# Patient Record
Sex: Male | Born: 1935 | Race: White | Hispanic: No | Marital: Married | Smoking: Former smoker
Health system: Southern US, Community
[De-identification: ages and names within clinical notes are randomized; demographics above are authoritative.]

## PROBLEM LIST (undated history)

## (undated) DIAGNOSIS — E785 Hyperlipidemia, unspecified: Secondary | ICD-10-CM

## (undated) DIAGNOSIS — S42309A Unspecified fracture of shaft of humerus, unspecified arm, initial encounter for closed fracture: Secondary | ICD-10-CM

## (undated) DIAGNOSIS — H9190 Unspecified hearing loss, unspecified ear: Secondary | ICD-10-CM

## (undated) DIAGNOSIS — D649 Anemia, unspecified: Secondary | ICD-10-CM

## (undated) DIAGNOSIS — M109 Gout, unspecified: Secondary | ICD-10-CM

## (undated) DIAGNOSIS — K602 Anal fissure, unspecified: Secondary | ICD-10-CM

## (undated) DIAGNOSIS — I714 Abdominal aortic aneurysm, without rupture, unspecified: Secondary | ICD-10-CM

## (undated) DIAGNOSIS — C4441 Basal cell carcinoma of skin of scalp and neck: Secondary | ICD-10-CM

## (undated) DIAGNOSIS — I739 Peripheral vascular disease, unspecified: Secondary | ICD-10-CM

## (undated) DIAGNOSIS — Z950 Presence of cardiac pacemaker: Secondary | ICD-10-CM

## (undated) DIAGNOSIS — I4891 Unspecified atrial fibrillation: Secondary | ICD-10-CM

## (undated) DIAGNOSIS — Z9289 Personal history of other medical treatment: Secondary | ICD-10-CM

## (undated) DIAGNOSIS — I4892 Unspecified atrial flutter: Secondary | ICD-10-CM

## (undated) DIAGNOSIS — I499 Cardiac arrhythmia, unspecified: Secondary | ICD-10-CM

## (undated) DIAGNOSIS — I495 Sick sinus syndrome: Secondary | ICD-10-CM

## (undated) DIAGNOSIS — I10 Essential (primary) hypertension: Secondary | ICD-10-CM

## (undated) DIAGNOSIS — M1990 Unspecified osteoarthritis, unspecified site: Secondary | ICD-10-CM

## (undated) HISTORY — DX: Abdominal aortic aneurysm, without rupture: I71.4

## (undated) HISTORY — DX: Unspecified atrial flutter: I48.92

## (undated) HISTORY — DX: Unspecified atrial fibrillation: I48.91

## (undated) HISTORY — DX: Anal fissure, unspecified: K60.2

## (undated) HISTORY — DX: Abdominal aortic aneurysm, without rupture, unspecified: I71.40

## (undated) HISTORY — DX: Unspecified fracture of shaft of humerus, unspecified arm, initial encounter for closed fracture: S42.309A

## (undated) HISTORY — PX: INGUINAL HERNIA REPAIR: SUR1180

## (undated) HISTORY — PX: CATARACT EXTRACTION W/ INTRAOCULAR LENS  IMPLANT, BILATERAL: SHX1307

## (undated) HISTORY — PX: SKIN CANCER EXCISION: SHX779

## (undated) HISTORY — DX: Hyperlipidemia, unspecified: E78.5

## (undated) HISTORY — DX: Sick sinus syndrome: I49.5

## (undated) HISTORY — DX: Essential (primary) hypertension: I10

## (undated) HISTORY — PX: TONSILLECTOMY: SUR1361

## (undated) HISTORY — DX: Unspecified osteoarthritis, unspecified site: M19.90

## (undated) HISTORY — PX: INSERT / REPLACE / REMOVE PACEMAKER: SUR710

## (undated) HISTORY — PX: ANAL FISSURE REPAIR: SHX2312

## (undated) HISTORY — DX: Peripheral vascular disease, unspecified: I73.9

## (undated) HISTORY — PX: OTHER SURGICAL HISTORY: SHX169

## (undated) HISTORY — PX: HEMORRHOID BANDING: SHX5850

## (undated) HISTORY — DX: Unspecified hearing loss, unspecified ear: H91.90

## (undated) HISTORY — PX: ABDOMINAL AORTIC ANEURYSM REPAIR: SUR1152

---

## 2007-07-10 ENCOUNTER — Ambulatory Visit: Payer: Self-pay | Admitting: Vascular Surgery

## 2007-10-09 ENCOUNTER — Encounter: Admission: RE | Admit: 2007-10-09 | Discharge: 2007-10-09 | Payer: Self-pay | Admitting: Vascular Surgery

## 2007-10-09 ENCOUNTER — Ambulatory Visit: Payer: Self-pay | Admitting: Vascular Surgery

## 2008-07-15 HISTORY — PX: US ECHOCARDIOGRAPHY: HXRAD669

## 2008-07-19 ENCOUNTER — Ambulatory Visit (HOSPITAL_COMMUNITY): Admission: RE | Admit: 2008-07-19 | Discharge: 2008-07-20 | Payer: Self-pay | Admitting: *Deleted

## 2008-09-16 ENCOUNTER — Ambulatory Visit: Payer: Self-pay | Admitting: Vascular Surgery

## 2008-09-16 ENCOUNTER — Encounter: Admission: RE | Admit: 2008-09-16 | Discharge: 2008-09-16 | Payer: Self-pay | Admitting: Vascular Surgery

## 2009-03-07 ENCOUNTER — Ambulatory Visit: Payer: Self-pay | Admitting: Vascular Surgery

## 2009-03-08 ENCOUNTER — Ambulatory Visit: Payer: Self-pay | Admitting: Vascular Surgery

## 2009-09-08 ENCOUNTER — Encounter (INDEPENDENT_AMBULATORY_CARE_PROVIDER_SITE_OTHER): Payer: Self-pay | Admitting: *Deleted

## 2009-09-08 ENCOUNTER — Ambulatory Visit: Payer: Self-pay | Admitting: Vascular Surgery

## 2009-09-08 ENCOUNTER — Encounter: Admission: RE | Admit: 2009-09-08 | Discharge: 2009-09-08 | Payer: Self-pay | Admitting: Vascular Surgery

## 2009-09-12 ENCOUNTER — Encounter: Payer: Self-pay | Admitting: Internal Medicine

## 2009-09-12 ENCOUNTER — Ambulatory Visit: Payer: Self-pay | Admitting: Cardiology

## 2009-11-09 ENCOUNTER — Ambulatory Visit: Payer: Self-pay | Admitting: Cardiology

## 2009-12-09 ENCOUNTER — Encounter: Payer: Self-pay | Admitting: Internal Medicine

## 2010-01-10 ENCOUNTER — Ambulatory Visit: Payer: Self-pay | Admitting: Internal Medicine

## 2010-01-10 DIAGNOSIS — I4892 Unspecified atrial flutter: Secondary | ICD-10-CM | POA: Insufficient documentation

## 2010-01-10 DIAGNOSIS — I4891 Unspecified atrial fibrillation: Secondary | ICD-10-CM | POA: Insufficient documentation

## 2010-01-10 DIAGNOSIS — I10 Essential (primary) hypertension: Secondary | ICD-10-CM

## 2010-02-12 ENCOUNTER — Telehealth: Payer: Self-pay | Admitting: Adult Health

## 2010-02-23 ENCOUNTER — Encounter (INDEPENDENT_AMBULATORY_CARE_PROVIDER_SITE_OTHER): Payer: Self-pay | Admitting: *Deleted

## 2010-03-13 NOTE — Miscellaneous (Signed)
Summary: Device preload  Clinical Lists Changes  Observations: Added new observation of PPM INDICATN: A-fib (12/09/2009 9:40) Added new observation of MAGNET RTE: BOL 85 ERI 65 (12/09/2009 9:40) Added new observation of PPMLEADSTAT2: active (12/09/2009 9:40) Added new observation of PPMLEADSER2: [id number] (12/09/2009 9:40) Added new observation of PPMLEADMOD2: 5076  (12/09/2009 9:40) Added new observation of PPMLEADDOI2: 07/19/2008  (12/09/2009 9:40) Added new observation of PPMLEADLOC2: RV  (12/09/2009 9:40) Added new observation of PPMLEADSTAT1: active  (12/09/2009 9:40) Added new observation of PPMLEADSER1: [id number]  (12/09/2009 9:40) Added new observation of PPMLEADMOD1: 5076  (12/09/2009 9:40) Added new observation of PPMLEADDOI1: 07/19/2008  (12/09/2009 9:40) Added new observation of PPMLEADLOC1: RA  (12/09/2009 9:40) Added new observation of PPM IMP MD: Charlynn Court  (12/09/2009 9:40) Added new observation of PPM DOI: 07/19/2008  (12/09/2009 9:40) Added new observation of PPM SERL#: [id number]  (12/09/2009 9:40) Added new observation of PPM MODL#: [id number]  (12/09/2009 9:40) Added new observation of PACEMAKERMFG: Medtronic  (12/09/2009 9:40) Added new observation of PPM REFER MD: Vonna Drafts  (12/09/2009 9:40) Added new observation of PACEMAKER MD: Hillis Range, MD  (12/09/2009 9:40)      PPM Specifications Following MD:  Hillis Range, MD     Referring MD:  Vonna Drafts PPM Vendor:  Medtronic     PPM Model Number:  [id number]     PPM Serial Number:  [id number] PPM DOI:  07/19/2008     PPM Implanting MD:  Charlynn Court  Lead 1    Location: RA     DOI: 07/19/2008     Model #: [id number]     Serial #: [id number]     Status: active Lead 2    Location: RV     DOI: 07/19/2008     Model #: [id number]     Serial #: [id number]     Status: active  Magnet Response Rate:  BOL 85 ERI 65  Indications:  A-fib

## 2010-03-13 NOTE — Letter (Signed)
Summary: [facility]  [facility]   Imported By: Marylou Mccoy 01/19/2010 17:24:35  _____________________________________________________________________  External Attachment:    Type:   Image     Comment:   External Document

## 2010-03-13 NOTE — Assessment & Plan Note (Signed)
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  He reports doing well at this time.  He has atrial flutter and atrial fibrillation but is relatively asymptomatic with these.  He has been iniated on pradaxa without troubles with bleeding.  He remains very active.He  denies symptoms of palpitations, chest pain, shortness of breath, orthopnea, PND, lower extremity edema, dizziness, presyncope, syncope, or neurologic sequela. The patient is tolerating medications without difficulties and is otherwise without complaint today.   Current Medications (verified): 1)  Pradaxa 150 Mg Caps (Dabigatran Etexilate Mesylate) .... Two Times A Day 2)  Metoprolol Tartrate 50 Mg Tabs (Metoprolol Tartrate) .... Take One Tablet By Mouth Twice A Day 3)  Fosinopril Sodium 40 Mg Tabs (Fosinopril Sodium) .... 1/2 Tablet Once Daily 4)  Digoxin 0.125 Mg Tabs (Digoxin) .... Take One Tablet By Mouth Daily 5)  Finasteride 5 Mg Tabs (Finasteride) .... Once Daily 6)  Allopurinol 300 Mg Tabs (Allopurinol) .... Once Daily 7)  Simvastatin 20 Mg Tabs (Simvastatin) .... Take One Tablet By Mouth Daily At Bedtime 8)  Aspirin 81 Mg Tbec (Aspirin) .... Take One Tablet By Mouth Daily 9)  Vitamin D (Ergocalciferol) 50000 Unit Caps (Ergocalciferol) .... Montthly 10)  Calcium .... Uad  Allergies (verified): No Known Drug Allergies  Past History:  Past Medical History: Atrial fibrillation and atrial flutter Symptomatic bradycardia s/p PPM (MDT) Aortic aneurysm with prior stent graft repair.  This was done in [place] in 2000.  He had a   complication of the left limb of his graft occlusion and had a subsequent fem-fem bypass.  HL HTN  Past Surgical History: s/p PPM (MDT) 07/19/08   Family History: pt  unaware of significant FH  Social History: Pt lives in [place] He denies Tob or ETOH  Review of Systems       All systems are reviewed and negative except as listed in the HPI.   Vital Signs:  Patient profile:   77 year old male Height:      69 inches Weight:      184 pounds BMI:     27.27 Pulse rate:   72 / minute BP sitting:   150 / 82  (left arm)  Vitals Entered By: Laurance Flatten CMA (January 10, 2010 11:51 AM)  Physical Exam  General:  elderly, NAD Head:  normocephalic and atraumatic Eyes:  PERRLA/EOM intact; conjunctiva and lids normal. Mouth:  Teeth, gums and palate normal. Oral mucosa normal. Neck:  supple Chest Wall:  pacemaker pocket is well healed Lungs:  Clear bilaterally to auscultation and percussion. Heart:  Non-displaced PMI, chest non-tender; regular rate and rhythm, S1, S2 without murmurs, rubs or gallops. Carotid upstroke normal, no bruit. Normal abdominal aortic size, no bruits. Femorals normal pulses, no bruits. Pedals normal pulses. No edema, no varicosities. Abdomen:  Bowel sounds positive; abdomen soft and non-tender without masses, organomegaly, or hernias noted. No hepatosplenomegaly. Msk:  Back normal, normal gait. Muscle strength and tone normal. Pulses:  pulses normal in all 4 extremities Extremities:  No clubbing or cyanosis. Neurologic:  Alert and oriented x 3.   PPM Specifications Following MD:  Hillis Range, MD     Referring MD:  Vonna Drafts PPM Vendor:  Medtronic     PPM Model Number:  [id number]     PPM Serial Number:  [id number] PPM DOI:  07/19/2008     PPM Implanting MD:  Charlynn Court  Lead 1    Location: RA     DOI: 07/19/2008     Model #: 5076     Serial #: [id number]     Status: active Lead 2    Location: RV     DOI: 07/19/2008     Model #: 6237     Serial #: [id number]     Status: active  Magnet Response Rate:  BOL 85 ERI 65  Indications:  A-fib   PPM Follow Up Battery Voltage:  2.99 V       PPM Device  Measurements Atrium  Amplitude: 2.5 mV, Impedance: 480 ohms, Threshold: 0.5 V at 0.4 msec Right Ventricle  Amplitude: 5.9 mV, Impedance: 608 ohms, Threshold: 0.5 V at 0.4 msec  Episodes MS Episodes:  8595     Percent Mode Switch:  7.1%     Ventricular High Rate:  1     Atrial Pacing:  84.9%     Ventricular Pacing:  2.3%  Parameters Mode:  MVP     Lower Rate Limit:  70     Upper Rate Limit:  130 Paced AV Delay:  180     Sensed AV Delay:  150 Next Cardiology Appt Due:  06/12/2010 Tech Comments:  [facility]---8595 AFLUTTER EPISODES--LONGEST WAS 2 HRS.  7.1% OF TIME. + PRADAXA.  NORMAL DEVICE FUNCTION.  CHANGED RV OUTPUT FROM 2.0 TO 2.5 V.  ROV IN 6 MTHS W/DEVICE CLINIC.  PT PREFERS OV RATHER THAN CARELINK. Vella Kohler  January 10, 2010 12:06 PM MD Comments:  agree  Impression & Recommendations:  Problem # 1:  PACEMAKER, PERMANENT (ICD-V45.01) The patient has a pacemkaer implanted for bradycardia. He appears to be doing well normal pacemaker function as above  Problem # 2:  ATRIAL FLUTTER (ICD-427.32) The patient has a h/o afib and atrial flutter He is asymptomatic and appropriately anticoagulated with pradaxa Given advanced age, I would not recommend ablation or aggressive attempts at rhythm control at this time  Problem # 3:  ESSENTIAL HYPERTENSION, BENIGN (ICD-401.1) stable no changes  Patient Instructions: 1)  Your physician recommends that you schedule a follow-up appointment in: 6 months with device clinic. 2)  Your physician recommends that you continue on your current medications as directed. Please refer to the Current Medication list given to you today.

## 2010-03-15 NOTE — Letter (Signed)
Summary: New Patient letter  [facility]  [address]   [place], [place] [address]   Phone: [phone]  Fax: [phone]       02/23/2010 MRN: [id number]  [name] [name] [address] [place], [place]  [address]  Dear [name] [name],  Welcome to the Gastroenterology Division at [facility].    You are scheduled to see Dr.  Arlyce Dice on 04/10/2010 at 11:00am  on the 3rd floor at [facility], [address]  We ask that you try to arrive at our office 15 minutes prior to your appointment time to allow for check-in.  We would like you to complete the enclosed self-administered evaluation form prior to your visit and bring it with you on the day of your appointment.  We will review it with you.  Also, please bring a complete list of all your medications or, if you prefer, bring the medication bottles and we will list them.  Please bring your insurance card so that we may make a copy of it.  If your insurance requires a referral to see a specialist, please bring your referral form from your primary care physician.  Co-payments are due at the time of your visit and may be paid by cash, check or credit card.     Your office visit will consist of a consult with your physician (includes a physical exam), any laboratory testing he/she may order, scheduling of any necessary diagnostic testing (e.g. x-ray, ultrasound, CT-scan), and scheduling of a procedure (e.g. Endoscopy, Colonoscopy) if required.  Please allow enough time on your schedule to allow for any/all of these possibilities.    If you cannot keep your appointment, please call [phone] to cancel or reschedule prior to your appointment date.  This allows [place] the opportunity to schedule an appointment for another patient in need of care.  If you do not cancel or reschedule by 5 p.m. the business day prior to your appointment date, you will be charged a $50.00 late cancellation/no-show fee.    Thank you for  choosing [facility] for your medical needs.  We appreciate the opportunity to care for you.  Please visit [place] at our website  to learn more about our practice.                     Sincerely,                                                             The Gastroenterology Division

## 2010-03-15 NOTE — Progress Notes (Signed)
  Phone Note Call from Patient   Reason for Call: Acute Illness Action Taken: Phone Call Completed Summary of Call: Mr. [name] [name] called stating his blood pressure was elevated at 182/104.  He is on fosinopril 40 mg 1/2 tablet daily, and metoprolol 50 mg two times a day.  I asked him to take additional 20mg  of fosinopril.  He is to check again in 2 hours.  He returned the call and said that his BP is now 170/98 HR is 100.[name] [name]  He does not feel short of breath, dizziness or having chest pain.  I have advised him to take additional 25mg  of metoprolol .  If his BP remains elevated he is to come to ER or call us back. Initial call taken by: Joni Reining NP

## 2010-03-15 NOTE — Letter (Addendum)
Summary: Self-Recorded Medicine & Medical Hx  Self-Recorded Medicine & Medical Hx   Imported By: Marylou Mccoy 01/19/2010 17:19:15  _____________________________________________________________________  External Attachments:     1. Type:   Image          Comment:   External Document    2. Type:   Image          Comment:   External Document

## 2010-04-06 ENCOUNTER — Ambulatory Visit (INDEPENDENT_AMBULATORY_CARE_PROVIDER_SITE_OTHER): Payer: Medicare Other | Admitting: Cardiology

## 2010-04-06 DIAGNOSIS — I495 Sick sinus syndrome: Secondary | ICD-10-CM

## 2010-04-06 DIAGNOSIS — Z950 Presence of cardiac pacemaker: Secondary | ICD-10-CM

## 2010-04-06 DIAGNOSIS — I10 Essential (primary) hypertension: Secondary | ICD-10-CM

## 2010-04-10 ENCOUNTER — Encounter: Payer: Self-pay | Admitting: Gastroenterology

## 2010-04-10 ENCOUNTER — Ambulatory Visit (INDEPENDENT_AMBULATORY_CARE_PROVIDER_SITE_OTHER): Payer: Medicare Other | Admitting: Gastroenterology

## 2010-04-10 DIAGNOSIS — K648 Other hemorrhoids: Secondary | ICD-10-CM

## 2010-04-10 DIAGNOSIS — I495 Sick sinus syndrome: Secondary | ICD-10-CM | POA: Insufficient documentation

## 2010-04-19 NOTE — Letter (Signed)
Summary: Results Letter  [facility]  [address] [place], [place] [address]   Phone: [phone]  Fax: [phone]        April 10, 2010 MRN: [id number]    FREDDI SCHRAGER [address] [place], [place]  [address]    Dear Mr. Pulsifer,   It is my pleasure to have treated you recently as a new patient in my office.  I appreciate your confidence and the opportunity to participate in your care.  Since I do have a busy inpatient endoscopy schedule and office schedule, my office hours vary weekly.  I am, however, available for emergency calls every day through my office.  If I cannot promptly meet an urgent office appointment, another one of our gastroenterologists will be able to assist you.  My well-trained staff are prepared to help you at all times.  For emergencies after office hours, a physician from our gastroenterology section is always available through my 24-hour answering service.  While you are under my care, I encourage discussion of your questions and concerns, and I will be happy to return your calls as soon as I am available.  Once again, I welcome you as a new patient and I look forward to a happy and healthy relationship.   Sincerely,     Barbette Hair. Arlyce Dice, MD,FACG         Sincerely,  Louis Meckel MD  This letter has been electronically signed by your physician.  Appended Document: Results Letter letter mailed

## 2010-04-19 NOTE — Assessment & Plan Note (Signed)
Summary: HEMORRHOIDS.Lennon Alstrom  POL ADVISED   History of Present Illness Visit Type: Initial Consult Primary GI MD: Melvia Heaps MD [facility] Primary Provider: Jarome Matin, MD Requesting Provider: Jarome Matin, MD Chief Complaint: hemmorhoids History of Present Illness:    Mr. Hendershott is a pleasant, 77 year old white male referred at the request of Dr. Eloise Harman for evaluation of hemorrhoids.   Chief complaint is difficulty cleaning himself after bowel movement. He is without pain bleeding or change in bowel habits. He has regular bowel movements. He is on    pradaxa for atrial fibrillation.   Last colonoscopy was about 10 years ago.   GI Review of Systems    Reports abdominal pain.     Location of  Abdominal pain: lower abdomen.    Denies acid reflux, belching, bloating, chest pain, dysphagia with liquids, dysphagia with solids, heartburn, loss of appetite, nausea, vomiting, vomiting blood, weight loss, and  weight gain.      Reports diarrhea and  hemorrhoids.     Denies anal fissure, black tarry stools, change in bowel habit, constipation, diverticulosis, fecal incontinence, heme positive stool, irritable bowel syndrome, jaundice, light color stool, liver problems, rectal bleeding, and  rectal pain. Preventive Screening-Counseling & Management  Alcohol-Tobacco     Smoking Status: quit      Drug Use:  no.      Current Medications (verified): 1)  Pradaxa 150 Mg Caps (Dabigatran Etexilate Mesylate) .... Two Times A Day 2)  Metoprolol Tartrate 50 Mg Tabs (Metoprolol Tartrate) .... Take One Tablet By Mouth Twice A Day 3)  Fosinopril Sodium 40 Mg Tabs (Fosinopril Sodium) .... 1/2 Tablet Once Daily 4)  Digoxin 0.125 Mg Tabs (Digoxin) .... Take One Tablet By Mouth Daily 5)  Finasteride 5 Mg Tabs (Finasteride) .... Once Daily 6)  Allopurinol 300 Mg Tabs (Allopurinol) .... Once Daily 7)  Simvastatin 20 Mg Tabs (Simvastatin) .... Take One Tablet By Mouth Daily At Bedtime 8)  Aspirin 81 Mg Tbec  (Aspirin) .... Take One Tablet By Mouth Daily 9)  Vitamin D (Ergocalciferol) 50000 Unit Caps (Ergocalciferol) .... Montthly 10)  Calcium Carbonate 1.5 Gm Tablet .... Take 1 Tablet By Mouth Once Daily 11)  Docusate Sodium 100 Mg Caps (Docusate Sodium) .... Take 1 Caspsule By Mouth Once Daily 12)  Amlodipine Besylate 5 Mg Tabs (Amlodipine Besylate) .... Take 1 Tablet By Mouth Once Daily  Allergies (verified): No Known Drug Allergies  Past History:  Past Medical History: Atrial fibrillation and atrial flutter Symptomatic bradycardia s/p PPM (MDT) Aortic aneurysm with prior stent graft repair.  This was done in [place] in 2000.  He had a   complication of the left limb of his graft occlusion and had a subsequent fem-fem bypass.  HL HTN Anal Fissure  Past Surgical History: s/p PPM (MDT) 07/19/08  Cataract Extraction Hernia Surgery Pacemaker Tonsillectomy Abdominal aortic aneurysm repair x3 Melenoma removed x4  Social History: Pt lives in [place] He denies Tob or ETOH Occupation: Retired Psychologist, educational Patient is a former smoker.  Alcohol Use - yes Daily Caffeine Use Illicit Drug Use - no Smoking Status:  quit Drug Use:  no  Review of Systems       The patient complains of arthritis/joint pain, back pain, swelling of feet/legs, and urination - excessive.  The patient denies allergy/sinus, anemia, anxiety-new, blood in urine, breast changes/lumps, change in vision, confusion, cough, coughing up blood, depression-new, fainting, fatigue, fever, headaches-new, heart murmur, heart rhythm changes, itching, menstrual pain, muscle pains/cramps, night sweats, nosebleeds, pregnancy symptoms,  shortness of breath, skin rash, sleeping problems, sore throat, swollen lymph glands, thirst - excessive , urination - excessive , urination changes/pain, urine leakage, vision changes, and voice change.         All other systems were reviewed and were negative   Vital Signs:  Patient profile:   77  year old male Height:      69 inches Weight:      185 pounds BMI:     27.42 Pulse rate:   64 / minute Pulse rhythm:   regular BP sitting:   128 / 70  (left arm) Cuff size:   regular  Vitals Entered By: June McMurray CMA Duncan Dull) (April 10, 2010 11:23 AM)  Physical Exam  Additional Exam:  Physical Exam: General:   WDWN HEENT:   anicteric.  No pharyngeal abnormalities Neck:   No masses, thyroidmegaly Nodes:   No cervical, axillary, inguinal adenopathy Chest:    Clear to auscultation Cardiac:   No murmurs, gallops, rubs Abdomen:   BS active.  No abd masses, tenderness, organomegaly Rectal:   No masses.  Stool hemeoccut negative;  there is decreased sphincter tone. Prostate is symmetrically enlarged. There are  small grade 3 hemorrhoids. Extremities:   No cyanosis, clubbing, edema Skeletal:   No deformities Neuro:   Alert, oriented x3.  No focal abnormalitie     Impression & Recommendations:  Problem # 1:  INTERNAL HEMORRHOIDS WITHOUT MENTION COMP (ICD-455.0)  His symptoms of difficulty with cleaning are likely due to hemorrhoids.  Recommendations #1 Anusol HC suppositories  #2 fiber supplementation   Should his symptoms continue unabated despite medical therapy. I would consider band ligation of his hemorrhoids.  Problem # 2:  LONG-TERM (CURRENT) USE OF ANTICOAGULANTS (ICD-V58.61) Assessment: Comment Only  Problem # 3:  ATRIAL FLUTTER (ICD-427.32) Assessment: Comment Only  Problem # 4:  PACEMAKER, PERMANENT (ICD-V45.01) Assessment: Comment Only  Patient Instructions: 1)  Copy sent to : Jarome Matin, MD 2)  You can pick up your new rx from your pharmacy today 3)  The medication list was reviewed and reconciled.  All changed / newly prescribed medications were explained.  A complete medication list was provided to the patient / caregiver. Prescriptions: ANUSOL-HC 25 MG SUPP (HYDROCORTISONE ACETATE) take one PR  #14 x 1   Entered and Authorized by:    Louis Meckel MD   Signed by:   Louis Meckel MD on 04/10/2010   Method used:   Electronically to        [facility]  [phone]* (retail)       [address] [place], [place]  [address]       Ph: [phone] or [phone]       Fax: [phone]   RxID:   [id number]

## 2010-06-14 ENCOUNTER — Encounter: Payer: Medicare Other | Admitting: *Deleted

## 2010-06-19 ENCOUNTER — Other Ambulatory Visit: Payer: Self-pay | Admitting: Vascular Surgery

## 2010-06-19 DIAGNOSIS — I714 Abdominal aortic aneurysm, without rupture: Secondary | ICD-10-CM

## 2010-06-26 NOTE — Assessment & Plan Note (Signed)
OFFICE VISIT   [name] [name], [name] [name]  DOB:  [birth year]                                       09/08/2009  EAVWU#:[id number]   Patient presents today for a follow-up of his stent graft repair of  abdominal aortic aneurysm.  This was done in [place] in 2000.  He had a  complication of the left limb of his graft occlusion and had a  subsequent fem-fem bypass.  He is here today for CT follow-up of this.   On my last visit with patient, he was complaining of what seemed to be  neuropathic-type pain in his lower extremities, and he reports that this  has markedly improved since my last visit with him in January.  He has  had no specific treatment.  He has no new major difficulties.  He has no  symptoms referable to his aneurysm.   PHYSICAL EXAMINATION:  Abdominal exam reveals some pulsation in his  aorta.  He does have 2+ femoral pulses bilaterally with a patent fem-fem  bypass.  Blood pressure today is 133/72, pulse 73, respirations 18.  His  O2 saturations are 96% on room air.  HEENT is normal.  Musculoskeletal  shows no major deformity or cyanosis.  Neurologic:  No focal weakness or  paresthesias.  Skin without ulcers or rashes.   He had a CT scan today and due to his renal insufficiency, this was a  noncontrast study.  This shows no change in the appearance of his  aneurysm with a stable location of the stent graft and the maximal  diameter of his aorta at 8.9 cm.   I discussed this with patient and his wife and have recommended that we  drop back to every other year CT scan since this has been stable for  many years.  He will notify us should he develop any difficulty in the  interim.     Larina Earthly, M.D.  Electronically Signed   TFE/MEDQ  D:  09/08/2009  T:  09/08/2009  Job:  [id number]   cc:   Barry Dienes. Eloise Harman, M.D.  Chrisandra Carota

## 2010-06-26 NOTE — Assessment & Plan Note (Signed)
OFFICE VISIT   [name] [name], [name] [name]  DOB:  1919-09-13                                       03/08/2009  ZOXWR#:[id number]   The patient presents today for evaluation of lower extremity discomfort  and tingling.  He is well-known to me from follow-up of an aortic stent  graft placed in [place] in 2000.  I have been doing serial follow-ups of  his prior-placed stent graft since my first visit with him in May 2009.  He had his last CT scan for follow-up of this in August 2010.  He  presents today with a history of progressive discomfort in both groins  and both thighs, occasionally in his buttocks and occasionally in his  low back.  He reports a tingling sensation in his legs from his knees  distally.  He said he had had this at some degree in the past but has  been worse recently, and also he has discomfort in his groins which is  positional.  He denies any specific tenderness in his groins.   His social history is unchanged.  He is married.  He is here with his  wife today.   REVIEW OF SYSTEMS:  Otherwise unremarkable.  He does not have any  abdominal pain and does occasionally have some low back pain.   PHYSICAL EXAMINATION:  He is a well-developed white male appearing  younger than his stated age of 50.  He is in no acute stress.  Blood  pressure is 157/80, heart rate 70, respirations 18.  His HEENT is  normal.  Abdominal exam is nontender, no masses.  He does have a  prominent pulsation in his aorta.  Musculoskeletal shows no deformities,  no cyanosis.  Neurologically, no focal weakness or paresthesias.  Skin  is without ulcer or rashes.  He does have 2+ femoral pulses bilaterally  with no evidence of erythema or tenderness.  He has a palpable fem-fem  graft pulse.  He has 2+ popliteal pulses bilaterally.  He has 2+ to 3+  right posterior tibial pulse and a 1+ left posterior tibial pulse.   I had a long discussion with the patient and his family present.   I do  not feel that there is any association with his prior stent graft repair  to these newer symptoms.  It appears to be more related to a neurologic  event than other.  I have reassured him I do not feel any other workup  is warranted from a vascular standpoint and we will see him again in  August 2011 with a repeat CT scan.  I do not feel that he has any  evidence of infection or other false aneurysm or other etiology that  would explain this from a standpoint of his prior fem-fem bypass.  They  were reassured with this discussion and are comfortable with observation  only at this time.  I would suggest a neurologic evaluation should he  have progressive discomfort.     Larina Earthly, M.D.  Electronically Signed   TFE/MEDQ  D:  03/08/2009  T:  03/09/2009  Job:  [id number]   cc:   Barry Dienes. Eloise Harman, M.D.

## 2010-06-26 NOTE — Assessment & Plan Note (Signed)
OFFICE VISIT   [name] [name], [name] [name]  DOB:  08-Dec-1919                                       10/09/2007  ZOXWR#:[id number]   Here today for continued follow-up. The patient presents today for  follow-up of his stent graft due to abdominal aortic aneurysm.  I had  seen him initially in May 2009.  He had an extensive prior treatment for  his aneurysm initially in 1990 with an anterior graft and has had  subsequent retreatments over the course of the years.  He has a known  aneurysm sac of 8.5 cm.  He has no new difficulties since my last visit  with him in May, he has a walking program, reports occasional shin  splints that limit him but otherwise no difficulty.   PHYSICAL EXAM:  Well-developed, well-nourished white male appearing  stated age of 42.  His blood pressure is 150/80, pulse 72, respirations  18.  ABDOMINAL EXAM:  Reveals moderate obesity with no tenderness and no  aneurysm palpated.  He does have 2+ femoral pulses with a patent fem-fem  bypass.   He underwent a CT scan today and I have reviewed this with him.  This  shows no evidence of endoleak, he continues to have an 8.5 cm aneurysm  sac, he does have ectasia of the suprarenal aorta at 4.1 cm which I  discussed with him.  I am comfortable with his current treatment.  He  does have a stable aneurysm sac with an occluded limb in the fem-fem  bypass.  I discussed symptoms of the fem-fem graft occlusion and also  aneurysm leak.  He understands this and will notify us should he develop  any problems.  Otherwise, we will see him in 1 year with repeat CT scan.   Larina Earthly, M.D.  Electronically Signed   TFE/MEDQ  D:  10/09/2007  T:  10/12/2007  Job:  [id number]

## 2010-06-26 NOTE — Consult Note (Signed)
NEW PATIENT CONSULTATION   [name] [name], [name] [name]  DOB:  [birth year]                                       07/10/2007  ZOXWR#:[id number]   The patient presents today for ongoing followup of his extensive past  history for aneurysm repair.  He has a great deal of his medical records  with him from [place], [place], [facility], and also  followup in [place].  He had initially treatment of in infrarenal  abdominal aortic aneurysm with an Ancure graft in [facility] on  11/06/1998.  He subsequently had treatment in [place] which with what  sounds like coiling of what was described as a type 1 endoleak.  I do  not have any records of that procedure.  He subsequently underwent an  extensive operation which he reports took 9 hours at [facility] in  August 2006.  At that time he had his graft converted to an  aortouniiliac and had a fem-fem bypass.  Apparently had a type 1 endo  leak from the right limb of his aorta femoral Endograft.  Following that  surgery in 2006 he had extensive treatment in [place] with wound  problems from his fem-fem bypass, but subsequently healed these.  He had  been followed by Dr. Caleen Essex at [facility] in  [place], [place].  I have extensive records from Dr. Edward Jolly for  review.  His most recent CAT scan was in August 2008 showing maximal  size of his aneurysm sac at 8.5 cm and his most recent duplex was in  March showing no evidence of increasing size with a maximal diameter of  8.6 cm.  He has had a stable type 2 endoleak.  He, otherwise, is very  active and healthy, and is currently living in [place].   PAST HISTORY:  Significant for vertigo hypertension, benign prostatic  hypertrophy.  Does not have a family history of aneurysm.   SOCIAL HISTORY:  He quit smoking in 1973.  Prior to this he had a 50  pack year history.  Has occasional alcohol consumption.   ALLERGIES:  No known drug allergies.   MEDICATIONS:  Metoprolol, lisinopril, hydrochlorothiazide, Flomax,  Proscar, gemfibrozil, aspirin, and Os-Cal.   PHYSICAL EXAM:  Well-developed, well-nourished white male appearing  stated age of 54.  His carotid arteries have bruits bilaterally.  He has  2+ radial and 2+ femoral pulses bilaterally.  He does have palpable  popliteal pulses bilaterally with no evidence of aneurysm.  Abdominal  exam reveals moderate obesity and this is not tender.  His groin  incisions have completely healed.   I had a long discussion with the patient and his wife.  I explained the  need for ongoing surveillance as he is having done since placement of  his initial anterior graft in 2000.  He is due for a CAT scan in August  and we will see him at that time for a CAT scan evaluation.  He reports  that there was some issue regarding possible renal insufficiency and I  explained that we would check his renal function prior to perceive that  he is safe to proceed without contrast CT.  If not, he will have a  noncontrast CT and ultrasound of his stent graft.  I will see him again  in August with a CT scan.   Larina Earthly,  M.D.  Electronically Signed   TFE/MEDQ  D:  07/10/2007  T:  07/13/2007  Job:  [id number]   cc:   [facility]

## 2010-06-26 NOTE — Discharge Summary (Signed)
NAME:  [name] [name], [name] [name] NO.:  [id number]   MEDICAL RECORD NO.:  [id number]          PATIENT TYPE:  OIB   LOCATION:  2014                         FACILITY:  [facility]   PHYSICIAN:  Elmore Guise., M.D.DATE OF BIRTH:  08/09/1919   DATE OF ADMISSION:  07/19/2008  DATE OF DISCHARGE:  07/20/2008                               DISCHARGE SUMMARY   DISCHARGE DIAGNOSES:  1. History of sick sinus syndrome/tachycardia-bradycardia (status post      dual-chamber permanent pacemaker implant).  2. Hypertension.  3. Dyslipidemia.  4. Arthritis.   HISTORY OF PRESENT ILLNESS:  [name] [name] is a very pleasant 77 year old  white male who presented with episodes of atrial fibrillation.  He was  started on rate-control medicines.  He was noted to have significant  sinus pauses when he would convert to sinus rhythm.  His medications  were readjusted; however, he continued to have dizzy spells which  correlated with his bradycardia and sinus pauses.  He was referred for  dual-chamber permanent pacemaker implant.   HOSPITAL COURSE:  The patient's hospital course was uncomplicated.  He  underwent pacemaker implant on July 19, 2008.  He tolerated the procedure  well.  His postprocedure course went well.  He remained afebrile and in  sinus rhythm following his procedure.  He did have bouts of atrial  fibrillation when replacing his atrial lead.  He has been A-paced and V-  fibbed for the last 24 hours.  His blood pressure has ranged from 120-  130 over 60-70.  His O2 sats are 95%.  He has been up and ambulatory.  His pacemaker site looks good.  There is no swelling or erythema noted.  His postprocedure chest x-ray showed appropriate positioning of his  atrial and ventricular lead.  No pneumothorax was noted.  His pacemaker  was checked out on postprocedure day 1 and showed good sensing,  threshold, and impedance.  He will be discharged home today to continue  the following medications:  1.  Metoprolol 50 mg half tablet twice daily.  2. Fosinopril 40 mg half tablet daily.  3. Hydrochlorothiazide 25 mg half tablet daily.  4. Proscar 5 mg daily.  5. Lopid 600 mg half tablet daily.  6. Aspirin 81 mg daily.  7. Os-Cal 2 tablets daily.  8. Vicodin 5/500 one to two every 6 hours as needed for pain.   A postpacemaker restriction sheet was given to him and we discussed this  in detail.  He is to keep his pacemaker site clean and dry for the next  week.  He is to Betadine his Steri-Strips for the next 3 days.  He is to  increase his activity slowly.  He is to call if he has any swelling,  fever, discharge from the area.  I did discuss that he would need to be  placed on Coumadin because of his paroxysmal atrial fibrillation;  however, we will hold on starting until he presents back for his wound  check to hopefully prevent any problems with hematoma or excessive  bruising/bleeding.  All of his questions were answered prior to  discharge.      Elmore Guise., M.D.  Electronically Signed     Elmore Guise., M.D.  Electronically Signed    TWK/MEDQ  D:  07/20/2008  T:  07/21/2008  Job:  [id number]   cc:   Peter M. Swaziland, M.D.

## 2010-06-26 NOTE — Assessment & Plan Note (Signed)
OFFICE VISIT   JANARD, CULP  DOB:  1919/02/26                                       09/16/2008  ZOXWR#:[id number]   Patient presents today for continued follow-up of his stent graft repair  of abdominal aortic aneurysm.  He reports that he had underwent a recent  pacemaker implantation for tachybrady syndrome and has done well with  this.  He has no symptoms of claudication and does not have any  abdominal discomfort.  He continues to be active with a daily walking  program at age 72.   PHYSICAL EXAMINATION:  A well-developed, well-nourished white male  appearing stated age of 77.  Blood pressure is 145/80, pulse 74,  respirations 18.  His radial pulses are 2+.  He has 2+ femoral pulses  and palpable dorsalis pedis pulses.  Abdominal exam reveals moderate  obesity.  I do not feel an aneurysm.  He has a palpable fem-fem bypass  graft pulse.   He underwent CT scan today, and I reviewed this with him.  It shows no  change in his prior study from 1 year ago.  He does have a large native  aneurysm sac at 8.7 cm.  He has no evidence of type 1 endoleak.  He does  have a small, probable type 2 endoleak in the posterior portion of the  sac, which is unchanged from prior studies.  His left limb of his  Endograft is chronically occluded, and his fem-fem bypass is patent.   I discussed this at length with Mr. Hanken, his wife and son-in-law  present, and I have recommended that we see him again on a yearly basis  with CAT scan to rule out any change.  He is now 10 years out from his  initial stent graft placement at [facility].   Larina Earthly, M.D.  Electronically Signed   TFE/MEDQ  D:  09/16/2008  T:  09/19/2008  Job:  [id number]   cc:   Barry Dienes. Eloise Harman, M.D.  Peter M. Swazil[place], M.D.

## 2010-07-02 ENCOUNTER — Ambulatory Visit (INDEPENDENT_AMBULATORY_CARE_PROVIDER_SITE_OTHER): Payer: Medicare Other | Admitting: *Deleted

## 2010-07-02 DIAGNOSIS — I4892 Unspecified atrial flutter: Secondary | ICD-10-CM

## 2010-07-02 DIAGNOSIS — Z950 Presence of cardiac pacemaker: Secondary | ICD-10-CM

## 2010-07-02 DIAGNOSIS — I495 Sick sinus syndrome: Secondary | ICD-10-CM

## 2010-07-02 NOTE — Progress Notes (Signed)
Pacer check

## 2010-07-03 ENCOUNTER — Ambulatory Visit (INDEPENDENT_AMBULATORY_CARE_PROVIDER_SITE_OTHER): Payer: Medicare Other | Admitting: Gastroenterology

## 2010-07-03 ENCOUNTER — Encounter: Payer: Self-pay | Admitting: Gastroenterology

## 2010-07-03 VITALS — BP 118/64 | HR 68 | Ht 69.0 in | Wt 187.0 lb

## 2010-07-03 DIAGNOSIS — K648 Other hemorrhoids: Secondary | ICD-10-CM

## 2010-07-03 NOTE — Assessment & Plan Note (Signed)
Symptoms have responded to rectal suppositories. I believe that his problems with hygiene are related to his prolapsing hemorrhoids. I explained that hemorrhoidal banding could ameliorate this condition. He will take this under advisement.

## 2010-07-03 NOTE — Patient Instructions (Signed)
Hemorrhoids Hemorrhoids are dilated (enlarged) veins around the rectum. Sometimes clots will form in the veins. This makes them swollen and painful. These are called thrombosed hemorrhoids. Causes of hemorrhoids include:  Pregnancy: this increases the pressure in the hemorrhoidal veins.   Constipation.   Straining to have a bowel movement.  HOME CARE INSTRUCTIONS  Eat a well balanced diet and drink 6 to 8 glasses of water every day to avoid constipation. You may also use a bulk laxative.   Avoid straining to have bowel movements.   Keep anal area dry and clean.   Only take over-the-counter or prescription medicines for pain, discomfort, or fever as directed by your caregiver.  If thrombosed:  Take hot sitz baths for 20 to 30 minutes, 3 to 4 times per day.   If the hemorrhoids are very tender and swollen, place ice packs on area as tolerated. Using ice packs between sitz baths may be helpful. Fill a plastic bag with ice and use a towel between the bag of ice and your skin.   Special creams and suppositories (Anusol, Nupercainal, Wyanoids) may be used or applied as directed.   Do not use a donut shaped pillow or sit on the toilet for long periods. This increases blood pooling and pain.   Move your bowels when your body has the urge; this will require less straining and will decrease pain and pressure.  Only take over-the-counter or prescription medicines for pain, discomfort, or fever as directed by your caregiver.  SEEK MEDICAL CARE IF:  You have increasing pain and swelling that is not controlled with your prescription.   You have uncontrolled bleeding.   You have an inability or difficulty having a bowel movement.   You have pain or inflammation outside the area of the hemorrhoids.    MAKE SURE YOU:   Understand these instructions.   Will watch your condition.   Will get help right away if you are not doing well or get worse.  Document Released: 01/26/2000 Document  Re-Released: 01/11/2008 [facility] Patient Information 2011 [place], [place]. Please consider a hemorrhoidal banding in the future If you decide contact our office to schedule

## 2010-07-03 NOTE — Progress Notes (Signed)
History of Present Illness:  Mr. Kinney has returned for followup of his hemorrhoids. He used Anusol a.c. Suppositories and  reports no further bleeding or discomfort. His main complaint is difficulty cleaning himself following a bowel movement due to protrusion of hemorrhoids.    Review of Systems: Pertinent positive and negative review of systems were noted in the above HPI section. All other review of systems were otherwise negative.    Current Medications, Allergies, Past Medical History, Past Surgical History, Family History and Social History were reviewed in [facility] electronic medical record  Vital signs were reviewed in today's medical record. Physical Exam: General: Well developed , well nourished, no acute distress  Extremities: No clubbing, cyanosis, edema or deformities noted Neurological: Alert oriented x 4, grossly nonfocal Psychological:  Alert and cooperative. Normal mood and affect

## 2010-07-18 ENCOUNTER — Telehealth: Payer: Self-pay | Admitting: Gastroenterology

## 2010-07-18 NOTE — Telephone Encounter (Signed)
Pt would like to schedule hemorrhoid banding. Does he just need a flex-sig with banding? Please advise.

## 2010-07-19 NOTE — Telephone Encounter (Signed)
yes

## 2010-07-23 NOTE — Telephone Encounter (Signed)
Left message for pt to call back  °

## 2010-07-23 NOTE — Telephone Encounter (Signed)
Pt aware of appt dates and times. 

## 2010-07-23 NOTE — Telephone Encounter (Signed)
Pt scheduled for previsit for 08/16/10@2pm , Flex-sig with banding scheduled at [facility] for 08/22/10@12 :30pm. Scheduled with Noreene Larsson #[id number]. Left message for pt to call back regarding appt dates and times.

## 2010-08-06 ENCOUNTER — Telehealth: Payer: Self-pay

## 2010-08-06 NOTE — Telephone Encounter (Signed)
Appt date and time ok per Dr. Arlyce Dice.

## 2010-08-06 NOTE — Telephone Encounter (Signed)
Message copied by Michele Mcalpine on Mon Aug 06, 2010 10:33 AM ------      Message from: Melvia Heaps MD D      Created: Mon Aug 06, 2010 10:18 AM      Regarding: RE: Reschedule       ok      ----- Message -----         From: Lily Lovings, RN         Sent: 08/02/2010   4:39 PM           To: Louis Meckel, MD      Subject: Reschedule                                               Dr. Arlyce Dice I had to reschedule this pts flex sig with banding to 08/29/10@12 :30pm. Just wanted to make sure that was ok with you. I had a hard time finding a spot.

## 2010-08-16 ENCOUNTER — Telehealth: Payer: Self-pay | Admitting: Cardiology

## 2010-08-16 ENCOUNTER — Telehealth: Payer: Self-pay | Admitting: *Deleted

## 2010-08-16 ENCOUNTER — Ambulatory Visit (AMBULATORY_SURGERY_CENTER): Payer: Medicare Other | Admitting: *Deleted

## 2010-08-16 VITALS — Ht 69.0 in | Wt 182.3 lb

## 2010-08-16 DIAGNOSIS — K648 Other hemorrhoids: Secondary | ICD-10-CM

## 2010-08-16 NOTE — Telephone Encounter (Signed)
Spoke with Dr Renie Ora office at [facility] about pts Pradaxa, Pt having procedure Flex with Banding on 7/18. They will fax ok as soon as Dr Thomasene Lot looks at it

## 2010-08-16 NOTE — Telephone Encounter (Signed)
PATIENT ON PRODAXIA, DR. KAPLAN NEEDS SOMETHING FAXED TO [place] PATIENT COMING OFF PRODAXIA.  PT HAVING FLEX WITH BANDING

## 2010-08-17 NOTE — Telephone Encounter (Signed)
Dr. Arlyce Dice is scheduled to do banding of hemorrhoids on 7/18 and wanted to know if should stop Pradaxa. Will check w/Dr.Jo on Monday and call him back. Will fax to Dr. Arlyce Dice.

## 2010-08-20 ENCOUNTER — Telehealth: Payer: Self-pay | Admitting: *Deleted

## 2010-08-20 NOTE — Telephone Encounter (Signed)
In response to his question about stopping Pradaxa before hemorrhoid banding. Dr. Swaziland said should stop Pradaxa 2 days prior to app and 3 days after procedure. Faxed note to Dr. Melvia Heaps.

## 2010-08-20 NOTE — Telephone Encounter (Signed)
Received letter from Dr Swaziland to have pt come off Pradaxa 48 hours prior to procedure and three days after. Per form faxed over

## 2010-08-22 ENCOUNTER — Encounter: Payer: Medicare Other | Admitting: Gastroenterology

## 2010-08-29 ENCOUNTER — Encounter: Payer: Medicare Other | Admitting: Gastroenterology

## 2010-08-29 ENCOUNTER — Ambulatory Visit (HOSPITAL_COMMUNITY)
Admission: RE | Admit: 2010-08-29 | Discharge: 2010-08-29 | Disposition: A | Discharge status: Home / Self Care | Payer: Medicare Other | Source: Ambulatory Visit | Attending: Gastroenterology | Admitting: Gastroenterology

## 2010-08-29 DIAGNOSIS — K648 Other hemorrhoids: Secondary | ICD-10-CM

## 2010-08-29 DIAGNOSIS — Z950 Presence of cardiac pacemaker: Secondary | ICD-10-CM | POA: Insufficient documentation

## 2010-09-04 ENCOUNTER — Other Ambulatory Visit: Payer: Self-pay | Admitting: Cardiology

## 2010-09-04 MED ORDER — DABIGATRAN ETEXILATE MESYLATE 150 MG PO CAPS: 150.0000 mg | ORAL_CAPSULE | Freq: Two times a day (BID) | ORAL | Status: DC

## 2010-09-04 NOTE — Telephone Encounter (Signed)
Called requesting refill on Pradaxa. [facility]

## 2010-09-04 NOTE — Telephone Encounter (Signed)
Has a question regarding an [facility] prescription that he spoke with you about last week. He said that they do not have the refill auth.

## 2010-09-07 ENCOUNTER — Inpatient Hospital Stay (HOSPITAL_COMMUNITY)
Admission: EM | Admit: 2010-09-07 | Discharge: 2010-09-12 | DRG: 920 | Disposition: A | Discharge status: Home / Self Care | Payer: Medicare Other | Attending: Internal Medicine | Admitting: Internal Medicine

## 2010-09-07 ENCOUNTER — Telehealth: Payer: Self-pay | Admitting: Gastroenterology

## 2010-09-07 DIAGNOSIS — Z950 Presence of cardiac pacemaker: Secondary | ICD-10-CM

## 2010-09-07 DIAGNOSIS — K626 Ulcer of anus and rectum: Secondary | ICD-10-CM | POA: Diagnosis present

## 2010-09-07 DIAGNOSIS — K922 Gastrointestinal hemorrhage, unspecified: Secondary | ICD-10-CM

## 2010-09-07 DIAGNOSIS — R579 Shock, unspecified: Secondary | ICD-10-CM

## 2010-09-07 DIAGNOSIS — D62 Acute posthemorrhagic anemia: Secondary | ICD-10-CM

## 2010-09-07 DIAGNOSIS — I4891 Unspecified atrial fibrillation: Secondary | ICD-10-CM

## 2010-09-07 DIAGNOSIS — Z7901 Long term (current) use of anticoagulants: Secondary | ICD-10-CM

## 2010-09-07 DIAGNOSIS — IMO0002 Reserved for concepts with insufficient information to code with codable children: Principal | ICD-10-CM | POA: Diagnosis present

## 2010-09-07 DIAGNOSIS — D696 Thrombocytopenia, unspecified: Secondary | ICD-10-CM | POA: Diagnosis present

## 2010-09-07 DIAGNOSIS — M129 Arthropathy, unspecified: Secondary | ICD-10-CM | POA: Diagnosis present

## 2010-09-07 DIAGNOSIS — Z87891 Personal history of nicotine dependence: Secondary | ICD-10-CM

## 2010-09-07 DIAGNOSIS — K648 Other hemorrhoids: Secondary | ICD-10-CM | POA: Diagnosis present

## 2010-09-07 DIAGNOSIS — Z7982 Long term (current) use of aspirin: Secondary | ICD-10-CM

## 2010-09-07 DIAGNOSIS — T8119XA Other postprocedural shock, initial encounter: Secondary | ICD-10-CM | POA: Diagnosis present

## 2010-09-07 DIAGNOSIS — I4892 Unspecified atrial flutter: Secondary | ICD-10-CM | POA: Diagnosis present

## 2010-09-07 DIAGNOSIS — Y849 Medical procedure, unspecified as the cause of abnormal reaction of the patient, or of later complication, without mention of misadventure at the time of the procedure: Secondary | ICD-10-CM | POA: Diagnosis present

## 2010-09-07 DIAGNOSIS — N179 Acute kidney failure, unspecified: Secondary | ICD-10-CM | POA: Diagnosis present

## 2010-09-07 DIAGNOSIS — E441 Mild protein-calorie malnutrition: Secondary | ICD-10-CM | POA: Diagnosis present

## 2010-09-07 LAB — CBC
Hemoglobin: 12.6 g/dL — ABNORMAL LOW (ref 13.0–17.0)
MCHC: 33.1 g/dL (ref 30.0–36.0)
MCV: 94.5 fL (ref 78.0–100.0)
MCV: 92.3 fL (ref 78.0–100.0)
Platelets: 167 10*3/uL (ref 150–400)
Platelets: 92 10*3/uL — ABNORMAL LOW (ref 150–400)
RBC: 4.03 MIL/uL — ABNORMAL LOW (ref 4.22–5.81)
RDW: 15.3 % (ref 11.5–15.5)
WBC: 10.9 10*3/uL — ABNORMAL HIGH (ref 4.0–10.5)
WBC: 7.9 10*3/uL (ref 4.0–10.5)

## 2010-09-07 LAB — PROTIME-INR
INR: 1.49 (ref 0.00–1.49)
Prothrombin Time: 18.3 seconds — ABNORMAL HIGH (ref 11.6–15.2)

## 2010-09-07 LAB — COMPREHENSIVE METABOLIC PANEL
ALT: 8 U/L (ref 0–53)
Albumin: 3.4 g/dL — ABNORMAL LOW (ref 3.5–5.2)
Chloride: 109 mEq/L (ref 96–112)
Creatinine, Ser: 1.39 mg/dL — ABNORMAL HIGH (ref 0.50–1.35)
Glucose, Bld: 141 mg/dL — ABNORMAL HIGH (ref 70–99)
Potassium: 5 mEq/L (ref 3.5–5.1)
Total Bilirubin: 0.6 mg/dL (ref 0.3–1.2)
Total Protein: 6.1 g/dL (ref 6.0–8.3)

## 2010-09-07 LAB — DIFFERENTIAL
Basophils Relative: 1 % (ref 0–1)
Eosinophils Absolute: 0.1 10*3/uL (ref 0.0–0.7)
Lymphocytes Relative: 15 % (ref 12–46)
Monocytes Absolute: 0.4 10*3/uL (ref 0.1–1.0)

## 2010-09-07 LAB — ABO/RH: ABO/RH(D): O NEG

## 2010-09-07 LAB — CARDIAC PANEL(CRET KIN+CKTOT+MB+TROPI): Relative Index: INVALID (ref 0.0–2.5)

## 2010-09-07 LAB — GLUCOSE, CAPILLARY

## 2010-09-07 MED ORDER — HYDROCORTISONE ACETATE 25 MG RE SUPP: 25.0000 mg | Freq: Two times a day (BID) | RECTAL | Status: DC

## 2010-09-07 NOTE — Telephone Encounter (Signed)
Received another call from the pts family. Family states that he has passed what appears to be some clots and he still has a steady drip of bright red blood from his rectum. Instructed them to take the pt to the ER to be evaluated.

## 2010-09-07 NOTE — Telephone Encounter (Signed)
Pts wife states that her husband had a bowel movement and is having rectal bleeding. States he is sitting on the toilet now and there is a steady drip of bright red blood. Spoke with Willette Cluster NP. Pt has not been using Anusol HC supp. Per Gunnar Fusi pt needs to use a supp now and is to use them twice a day. Pt should call us back this afternoon if the bleeding does not stop. Pts daughter verbalized understanding.

## 2010-09-08 ENCOUNTER — Inpatient Hospital Stay (HOSPITAL_COMMUNITY): Payer: Medicare Other

## 2010-09-08 LAB — CBC
HCT: 23.4 % — ABNORMAL LOW (ref 39.0–52.0)
HCT: 21.9 % — ABNORMAL LOW (ref 39.0–52.0)
HCT: 22 % — ABNORMAL LOW (ref 39.0–52.0)
Hemoglobin: 8 g/dL — ABNORMAL LOW (ref 13.0–17.0)
Hemoglobin: 7.3 g/dL — ABNORMAL LOW (ref 13.0–17.0)
Hemoglobin: 7.5 g/dL — ABNORMAL LOW (ref 13.0–17.0)
Hemoglobin: 7.9 g/dL — ABNORMAL LOW (ref 13.0–17.0)
MCH: 31.5 pg (ref 26.0–34.0)
MCH: 31.2 pg (ref 26.0–34.0)
MCHC: 34.2 g/dL (ref 30.0–36.0)
MCHC: 34.1 g/dL (ref 30.0–36.0)
MCHC: 34.1 g/dL (ref 30.0–36.0)
MCV: 92.2 fL (ref 78.0–100.0)
MCV: 92.1 fL (ref 78.0–100.0)
MCV: 92.4 fL (ref 78.0–100.0)
MCV: 92.1 fL (ref 78.0–100.0)
MCV: 91.7 fL (ref 78.0–100.0)
Platelets: 83 10*3/uL — ABNORMAL LOW (ref 150–400)
Platelets: 79 10*3/uL — ABNORMAL LOW (ref 150–400)
RBC: 2.69 MIL/uL — ABNORMAL LOW (ref 4.22–5.81)
RBC: 2.54 MIL/uL — ABNORMAL LOW (ref 4.22–5.81)
RBC: 2.37 MIL/uL — ABNORMAL LOW (ref 4.22–5.81)
RBC: 2.53 MIL/uL — ABNORMAL LOW (ref 4.22–5.81)
RDW: 15.3 % (ref 11.5–15.5)
WBC: 5.2 10*3/uL (ref 4.0–10.5)
WBC: 3.5 10*3/uL — ABNORMAL LOW (ref 4.0–10.5)

## 2010-09-08 LAB — CARDIAC PANEL(CRET KIN+CKTOT+MB+TROPI)
Relative Index: INVALID (ref 0.0–2.5)
Relative Index: INVALID (ref 0.0–2.5)
Total CK: 76 U/L (ref 7–232)
Total CK: 95 U/L (ref 7–232)
Troponin I: <0.3 ng/mL (ref <0.30)

## 2010-09-08 LAB — DIFFERENTIAL
Basophils Absolute: 0 10*3/uL (ref 0.0–0.1)
Eosinophils Relative: 3 % (ref 0–5)
Lymphocytes Relative: 22 % (ref 12–46)
Lymphs Abs: 1 10*3/uL (ref 0.7–4.0)
Monocytes Absolute: 0.3 10*3/uL (ref 0.1–1.0)
Neutro Abs: 3 10*3/uL (ref 1.7–7.7)

## 2010-09-08 LAB — PREPARE FRESH FROZEN PLASMA

## 2010-09-08 LAB — BASIC METABOLIC PANEL
Calcium: 7.8 mg/dL — ABNORMAL LOW (ref 8.4–10.5)
Chloride: 113 mEq/L — ABNORMAL HIGH (ref 96–112)
Creatinine, Ser: 1.2 mg/dL (ref 0.50–1.35)
GFR calc non Af Amer: 57 mL/min — ABNORMAL LOW (ref >60)

## 2010-09-09 LAB — BASIC METABOLIC PANEL
BUN: 15 mg/dL (ref 6–23)
Calcium: 8.3 mg/dL — ABNORMAL LOW (ref 8.4–10.5)
Creatinine, Ser: 1.02 mg/dL (ref 0.50–1.35)
GFR calc Af Amer: >60 mL/min (ref >60)
GFR calc non Af Amer: >60 mL/min (ref >60)

## 2010-09-09 LAB — CBC
HCT: 22 % — ABNORMAL LOW (ref 39.0–52.0)
MCH: 31.4 pg (ref 26.0–34.0)
MCH: 31.3 pg (ref 26.0–34.0)
MCHC: 33.6 g/dL (ref 30.0–36.0)
MCHC: 33.9 g/dL (ref 30.0–36.0)
MCHC: 34.2 g/dL (ref 30.0–36.0)
MCV: 92.1 fL (ref 78.0–100.0)
Platelets: 72 10*3/uL — ABNORMAL LOW (ref 150–400)
Platelets: 80 10*3/uL — ABNORMAL LOW (ref 150–400)
Platelets: 81 10*3/uL — ABNORMAL LOW (ref 150–400)
Platelets: 93 10*3/uL — ABNORMAL LOW (ref 150–400)
RBC: 2.56 MIL/uL — ABNORMAL LOW (ref 4.22–5.81)
RDW: 15.3 % (ref 11.5–15.5)
RDW: 15.3 % (ref 11.5–15.5)
WBC: 5.8 10*3/uL (ref 4.0–10.5)

## 2010-09-09 LAB — PROTIME-INR: Prothrombin Time: 14.4 seconds (ref 11.6–15.2)

## 2010-09-10 DIAGNOSIS — D62 Acute posthemorrhagic anemia: Secondary | ICD-10-CM

## 2010-09-10 DIAGNOSIS — K626 Ulcer of anus and rectum: Secondary | ICD-10-CM

## 2010-09-10 DIAGNOSIS — K648 Other hemorrhoids: Secondary | ICD-10-CM

## 2010-09-10 DIAGNOSIS — K922 Gastrointestinal hemorrhage, unspecified: Secondary | ICD-10-CM

## 2010-09-10 DIAGNOSIS — I4891 Unspecified atrial fibrillation: Secondary | ICD-10-CM

## 2010-09-10 LAB — PREPARE RBC (CROSSMATCH)

## 2010-09-10 LAB — CBC
HCT: 22.3 % — ABNORMAL LOW (ref 39.0–52.0)
Hemoglobin: 7.7 g/dL — ABNORMAL LOW (ref 13.0–17.0)
Hemoglobin: 8.6 g/dL — ABNORMAL LOW (ref 13.0–17.0)
MCH: 31.8 pg (ref 26.0–34.0)
MCHC: 33.2 g/dL (ref 30.0–36.0)
Platelets: 91 10*3/uL — ABNORMAL LOW (ref 150–400)
RBC: 2.42 MIL/uL — ABNORMAL LOW (ref 4.22–5.81)
RDW: 15.5 % (ref 11.5–15.5)
WBC: 4.7 10*3/uL (ref 4.0–10.5)

## 2010-09-10 LAB — BASIC METABOLIC PANEL
CO2: 26 mEq/L (ref 19–32)
Calcium: 8.5 mg/dL (ref 8.4–10.5)
Glucose, Bld: 122 mg/dL — ABNORMAL HIGH (ref 70–99)
Potassium: 4.5 mEq/L (ref 3.5–5.1)
Sodium: 142 mEq/L (ref 135–145)

## 2010-09-11 DIAGNOSIS — K626 Ulcer of anus and rectum: Secondary | ICD-10-CM

## 2010-09-11 DIAGNOSIS — K922 Gastrointestinal hemorrhage, unspecified: Secondary | ICD-10-CM

## 2010-09-11 LAB — CBC
HCT: 23.1 % — ABNORMAL LOW (ref 39.0–52.0)
Platelets: 81 10*3/uL — ABNORMAL LOW (ref 150–400)
Platelets: 107 10*3/uL — ABNORMAL LOW (ref 150–400)
RBC: 2.64 MIL/uL — ABNORMAL LOW (ref 4.22–5.81)
RDW: 15.6 % — ABNORMAL HIGH (ref 11.5–15.5)
WBC: 4.3 10*3/uL (ref 4.0–10.5)
WBC: 5.9 10*3/uL (ref 4.0–10.5)

## 2010-09-11 LAB — CROSSMATCH
ABO/RH(D): O NEG
Unit division: 0

## 2010-09-11 LAB — COMPREHENSIVE METABOLIC PANEL
ALT: 9 U/L (ref 0–53)
AST: 13 U/L (ref 0–37)
Albumin: 2.9 g/dL — ABNORMAL LOW (ref 3.5–5.2)
Alkaline Phosphatase: 38 U/L — ABNORMAL LOW (ref 39–117)
BUN: 13 mg/dL (ref 6–23)
CO2: 28 mEq/L (ref 19–32)
Calcium: 8.5 mg/dL (ref 8.4–10.5)
Chloride: 109 mEq/L (ref 96–112)
Creatinine, Ser: 0.91 mg/dL (ref 0.50–1.35)
GFR calc Af Amer: >60 mL/min (ref >60)
GFR calc non Af Amer: >60 mL/min (ref >60)
Glucose, Bld: 110 mg/dL — ABNORMAL HIGH (ref 70–99)
Potassium: 4.1 mEq/L (ref 3.5–5.1)
Sodium: 141 mEq/L (ref 135–145)
Total Bilirubin: 0.4 mg/dL (ref 0.3–1.2)
Total Protein: 5.1 g/dL — ABNORMAL LOW (ref 6.0–8.3)

## 2010-09-11 LAB — DIGOXIN LEVEL: Digoxin Level: 0.4 ng/mL — ABNORMAL LOW (ref 0.8–2.0)

## 2010-09-12 LAB — CBC
HCT: 22.9 % — ABNORMAL LOW (ref 39.0–52.0)
Hemoglobin: 7.6 g/dL — ABNORMAL LOW (ref 13.0–17.0)
RBC: 2.42 MIL/uL — ABNORMAL LOW (ref 4.22–5.81)
RDW: 15.8 % — ABNORMAL HIGH (ref 11.5–15.5)
WBC: 4.3 10*3/uL (ref 4.0–10.5)

## 2010-09-17 ENCOUNTER — Telehealth: Payer: Self-pay | Admitting: Cardiology

## 2010-09-17 NOTE — Telephone Encounter (Signed)
Was just recently d/c from hospital from GI bleed after banding from hemorrhoids. Per Dr. Swaziland can keep app for 9/6. Saw Dr. Eloise Harman today and was told he could restart Pradaxa on 8/10.

## 2010-09-17 NOTE — Telephone Encounter (Signed)
Pt called regarding scheduling post op followup, pt states is not sure he really needs to be seen due to Dr. Swaziland working with his PCP, pt would like call back to discuss if he needs to set up an appt

## 2010-09-20 NOTE — Consult Note (Addendum)
NAME:  [name] [name], [name] [name] NO.:  [id number]  MEDICAL RECORD NO.:  [id number]  LOCATION:  2109                         FACILITY:  [facility]  PHYSICIAN:  Pricilla Riffle, MD, FACCDATE OF BIRTH:  April 11, 1919  DATE OF CONSULTATION:  09/07/2010 DATE OF DISCHARGE:                                CONSULTATION   PRIMARY CARDIOLOGIST:  Peter M. Swaziland, MD and Hillis Range, MD  CHIEF COMPLAINT:  Rectal bleeding.  HISTORY OF PRESENT ILLNESS:  Mr. Vandam is a very pleasant 77 year old gentleman with a history of atrial fibrillation and atrial flutter, AAA, and sick sinus syndrome status post pacemaker implantation who was subsequently evaluated as an outpatient GI for internal hemorrhoids.  He had elective banding on August 29, 2010.  Prior to the procedure, he had stopped Pradaxa 48 hours before.  It was reinitiated 4 days ago and the patient has done well.  However, today, the patient noticed a trickle of rectal bleeding, eventually involving copious amounts of blood clots. In the ER, he was found to be initially an atrial tachycardia which appears to be atrial flutter with RVR with rates of about 150-170.  He received fluid bolus secondary to hypertension and blood pressures in the 70s initially.  He is on unit 2 of packed red blood cell transfusion.  Now, he is in sinus tach with a rate approximately 100-105 beats per minute with a blood pressure in the 90s.  Initial hemoglobin of 12.6.  He endorses occasional palpitations today earlier in the ER, but no chest pain or shortness of breath.  He has had no significant problems with his atrial fibrillation as recently.  He also endorsed some diarrhea, nausea as well as vomiting without hematemesis.  No fevers or chills.  PAST MEDICAL HISTORY: 1. Atrial fibrillation and atrial flutter, maintained on Pradaxa     chronically. 2. Sick sinus syndrome status post dual-chamber pacemaker     implantation, June 2010, [employer] device. 3.  Hypertension. 4. Dyslipidemia. 5. Arthritis. 6. Abdominal aortic aneurysm status post graft repair in [place] in     2000, 8.9 cm by CT on June 2011.     a.     Left LIMA graft occlusion resulting in fem-fem bypass      grafting. 7. Hemorrhoids, see above. 8. Status post cataract surgery. 9. Status post hernia surgery. 10.Status post tonsillectomy.  MEDICATIONS: 1. Pradaxa 150 mg b.i.d. 2. Aspirin 81 mg daily. 3. Colace 100 mg daily. 4. Lopressor 25 mg b.i.d. 5. Digoxin 0.125 mg daily. 6. Vitamin D. 7. Norvasc 5 mg daily. 8. Lisinopril 20 mg daily. 9. Zocor 20 mg daily. 10.Allopurinol 300 mg daily. 11.Calcium carbonate. 12.Finasteride 5 mg daily.  ALLERGIES:  No known drug allergies.  SOCIAL HISTORY:  The patient lives in [place].  He has accompanied by his wife and son-in-law.  He is retired.  He denies any alcohol or tobacco use.  FAMILY HISTORY:  He is on unsure if he had coronary artery disease in the family.  REVIEW OF SYSTEMS:  See HPI for pertinent positives.  All other systems reviewed and otherwise negative.  LABORATORY DATA:  WBC 10.9, hemoglobin 12.6, hematocrit 30.1, and platelet count 157, sodium 141,  potassium 5, chloride 109, CO2 is 21, glucose 141, BUN 20, creatinine 1.39.  LFTs are okay with the exception of decreased albumin 3.4.  RADIOLOGIC STUDIES:  Pending.  PHYSICAL EXAMINATION:  VITAL SIGNS:  Temperature 97.4, pulse 105, respirations 18, blood pressure 92/52. GENERAL:  This is a pleasant white male in no acute distress in Trendelenburg position, malodorous. HEENT:  Normocephalic atraumatic with extraocular movements intact. Clear sclerae.  Nares are without discharge. NECK:  Supple without JVD. HEART:  Auscultation of heart reveals tachycardic rate, regular rhythm without murmurs, rubs, or gallops. LUNGS:  Clear to auscultation bilaterally without wheezes, rales, or rhonchi. ABDOMEN:  Soft, nontender, nondistended with positive bowel  sounds.  No hepatosplenomegaly.  No rebound or guarding. EXTREMITIES:  Warm and dry and without edema.  He has 2+ pedal pulses bilaterally without ecchymosis. NEUROLOGICALLY:  He is alert and oriented x3, responds questions appropriately with a normal affect.  ASSESSMENT AND PLAN:  The patient was seen and examined by Dr. Tenny Craw and myself.  This is an 77 year old gentleman with a history of atrial fibrillation/atrial flutter, sick sinus syndrome status post pacemaker implantation and recent perineum hemorrhoids who presents to the [facility] with lower GI bleed.  He had brief episode of atrial flutter in the ER with heart rate, but now is maintaining sinus tachycardia with a more stable blood pressure following an episode of hypotension.  At that time, we agree with the blood transfusion and volume support.  Of course, Pradaxa will be stopped, critical care is investigating the reversal protocol.  We will follow the patient on telemetry and treat his rates as needed as his blood pressure allows.  We will continue to follow with you.     Ronie Spies, P.A.C.   ______________________________ Pricilla Riffle, MD, [facility]    DD/MEDQ  D:  09/07/2010  T:  09/08/2010  Job:  [id number]  cc:   Peter M. Swazil[place], M.D. Hillis Range, MD  Electronically Signed by Ronie Spies  on 09/20/2010 11:51:28 AM Electronically Signed by Dietrich Pates MD FACC on 10/22/2010 06:08:59 AM

## 2010-09-26 NOTE — Discharge Summary (Signed)
NAME:  [name] [name], [name] [name] NO.:  [id number]  MEDICAL RECORD NO.:  [id number]  LOCATION:  2007                         FACILITY:  [facility]  PHYSICIAN:  Barry Dienes. Eloise Harman, M.D.DATE OF BIRTH:  1919/10/28  DATE OF ADMISSION:  09/07/2010 DATE OF DISCHARGE:  09/12/2010                              DISCHARGE SUMMARY   PERTINENT FINDINGS:  The patient is an 77 year old man with a history of atrial fibrillation/flutter, abdominal aortic aneurysm, and sick sinus syndrome status post pacemaker implantation, who was recently treated as an outpatient for internal hemorrhoids with elective banding on August 29, 2010.  Before the procedure, he had stopped Pradaxa 48 hours prior and it was restarted 4 days ago.  Initially, he was doing well, however, on the day of admission he noticed a trickle of rectal bleeding followed by copious amount of blood with clots, so he presented to the emergency room for evaluation.  In the emergency room, he was initially noted to be in atrial tachycardia with ventricular rates of 150-170.  He was treated with bolus IV fluids as his blood pressure systolics were initially in the 70s and he was transfused with 2 units of packed red blood cells.  His initial hemoglobin was 12.6.  He denied having chest pain or shortness of breath.  He has not had recent fever or chills.  PAST MEDICAL HISTORY: 1. Atrial fibrillation and atrial flutter, maintained on Pradaxa. 2. Sick sinus syndrome, status post dual-chamber pacemaker     implantation June 2010. 3. Hypertension. 4. Dyslipidemia. 5. Osteoarthritis. 6. Abdominal aortic aneurysm, status post graft repair in [place] in     2000, 8.9 cm by CT scan in June 2011. 7. Hemorrhoids. 8. Status post cataract surgery. 9. Status post hernia surgery. 10.Status post tonsillectomy.  MEDICATIONS PRIOR TO HOSPITALIZATION: 1. Pradaxa 150 mg p.o. b.i.d. 2. Aspirin 81 mg p.o. daily. 3. Colace 100 mg p.o. daily. 4.  Lopressor 25 mg p.o. twice daily. 5. Digoxin 125 mcg p.o. daily. 6. Vitamin D 1 tablet p.o. daily. 7. Norvasc 5 mg p.o. daily. 8. Lisinopril 20 mg p.o. daily. 9. Zocor 20 mg p.o. daily. 10.Allopurinol 300 mg p.o. daily. 11.Calcium carbonate 1 tablet p.o. daily. 12.Finasteride 5 mg p.o. daily.  ALLERGIES:  No known drug allergies  SOCIAL HISTORY:  He lives in [place] and is married.  He has no history of alcohol or tobacco abuse.  INITIAL PHYSICAL EXAMINATION:  VITAL SIGNS:  Blood pressure 92/52, pulse 105, respirations 18, and temperature 97.4. GENERAL:  This is a pale white man who was in no acute distress in the Trendelenburg position. HEAD, EYES, EARS, NOSE, AND THROAT:  Within normal limits. NECK:  Supple without jugular venous distention. HEART:  Rapid irregular rhythm. CHEST:  Clear to auscultation. ABDOMEN:  Normal bowel sounds and no hepatosplenomegaly or tenderness. EXTREMITIES:  He had no significant peripheral edema and normal pedal pulses bilaterally. NEUROLOGICAL:  He is alert and oriented x3, he had mildly decreased hearing bilaterally.  He was able to move all extremities well and had a normal affect.  INITIAL LABORATORY STUDIES:  White blood cells 10.9, hemoglobin 12.6, hematocrit 30.1, and platelets 157.  Serum sodium 141, potassium 5, chloride 109,  carbon dioxide 21, glucose 141, BUN 20, creatinine 1.39, and serum albumin 3.4.  HOSPITAL COURSE:  The patient was admitted to a step-down ICU bed.  He was treated with fresh frozen plasma and transfusion of 2 units of packed red blood cells.  His Pradaxa was held and gradually his rectal bleeding subsided.  He was also given moderate dose IV fluids because of hypotension.  He had a flexible sigmoidoscopy done that showed multiple successful hemorrhoidal banding with some clotted areas where oozing had been at the base of the hemorrhoids.  COMPLICATIONS:  None.  CONDITION ON DISCHARGE:  He has complaints of  mild dyspnea with walking to the bathroom, but not at rest and no chest pain or palpitations.  He has been eating and drinking well and his bowel movements are now normal with no rectal bleeding.  VITAL SIGNS:  Most recent vital signs include blood pressure 153/77, pulse 70, respirations 18, temperature 98.2, and pulse oxygen saturation 98% on room air. GENERAL:  He is a pale, elderly white man who is in no apparent distress. HEAD, EYES, EARS, NOSE, AND THROAT:  Within normal limits. NECK:  Supple without jugular venous distention. CHEST:  Clear to auscultation. HEART:  Slightly irregular rhythm without significant murmur or gallop. ABDOMEN:  Normal bowel sounds and no hepatosplenomegaly or tenderness. EXTREMITIES:  Without cyanosis, clubbing, or edema. NEUROLOGICAL:  Significant solely for decreased hearing bilaterally.  MOST RECENT LABORATORY STUDIES:  White blood cells 4.3, hemoglobin 7.6, hematocrit 22.9, and platelets 91.  Serum digoxin 0.4, serum sodium 141, potassium 4.1, chloride 109, carbon dioxide 28, BUN 13, creatinine 0.91, glucose 110, total protein 5.1, and albumin 2.9.  DISCHARGE DIAGNOSES: 1. Severe rectal bleeding with hemorrhagic shock from recent     hemorrhoidal banding procedure. 2. Systemic anticoagulation with Pradaxa, currently on hold for the     next 9 days. 3. Anemia. 4. Thrombocytopenia. 5. Protein-calorie malnutrition. 6. Atrial fibrillation and atrial flutter. 7. Sick sinus syndrome, status post dual-chamber pacemaker. 8. Hypertension. 9. Dyslipidemia. 10.Osteoarthritis. 11.Status post abdominal aorta aortic aneurysm repair. 12.Benign prostatic hypertrophy. 13.Gout. 14.Constipation.  DISCHARGE MEDICATIONS: 1. Nu-Iron 150 one capsule p.o. twice daily. 2. Metoprolol tartrate 50 mg take 1/2 tablet p.o. twice daily. 3. Allopurinol 300 mg p.o. daily. 4. Ecotrin 81 mg p.o. daily. 5. Calcium carbonate 1 tablet p.o. daily. 6. Digoxin 125 mcg p.o.  daily. 7. Colace 100 mg p.o. daily. 8. Finasteride 5 mg p.o. daily. 9. Simvastatin 20 mg p.o. daily. 10.Vitamin D2 over-the-counter 1 tablet p.o. daily.  DISPOSITION AND FOLLOWUP:  He should call [facility] for a followup visit in approximately 7-9 days at which point we will recheck a CBC.  He has a followup visit scheduled with [facility] in mid July 2012.  He should follow up with Dr. Peter Swaziland per his recommendations.  He will be discharged to home and should call either Dr. Jarome Matin or [facility] should he have recurrence of his rectal bleeding.  He was advised to take it easy and minimize the level of exertion as it will take at least 1 month to normalize his blood counts.  Please note the process of discharge took 40 minutes.          ______________________________ Barry Dienes Eloise Harman, M.D.     DGP/MEDQ  D:  09/12/2010  T:  09/13/2010  Job:  [id number]  cc:   Peter M. [place], M.D.  Electronically Signed by Jarome Matin M.D. on 09/26/2010 08:43:30 AM

## 2010-10-09 ENCOUNTER — Encounter: Payer: Self-pay | Admitting: Cardiology

## 2010-10-17 ENCOUNTER — Ambulatory Visit: Payer: Medicare Other | Admitting: Gastroenterology

## 2010-10-23 ENCOUNTER — Encounter: Payer: Self-pay | Admitting: Cardiology

## 2010-10-23 ENCOUNTER — Ambulatory Visit (INDEPENDENT_AMBULATORY_CARE_PROVIDER_SITE_OTHER): Payer: Medicare Other | Admitting: Cardiology

## 2010-10-23 ENCOUNTER — Ambulatory Visit: Payer: Medicare Other | Admitting: Cardiology

## 2010-10-23 ENCOUNTER — Telehealth: Payer: Self-pay | Admitting: Cardiology

## 2010-10-23 DIAGNOSIS — I4892 Unspecified atrial flutter: Secondary | ICD-10-CM

## 2010-10-23 DIAGNOSIS — Z950 Presence of cardiac pacemaker: Secondary | ICD-10-CM

## 2010-10-23 NOTE — Assessment & Plan Note (Signed)
Patient scheduled for followup in our pacemaker clinic with Dr. Johney Frame on October 12. He will continue with metoprolol and digoxin.

## 2010-10-23 NOTE — Patient Instructions (Signed)
Stop taking ASA   Continue your other medications.  I will see you again in 6 months.

## 2010-10-23 NOTE — Progress Notes (Signed)
[name] [name] Date of Birth: 1919/06/17   History of Present Illness: [name] [name] is seen for followup after recent hospitalization with an acute lower GI bleed. This was following a banding procedure for hemorrhoids. He was resumed on Primaxin and suffered a severe bleed requiring transfusion. His anticoagulation was held for 10 days and since then he has been started back on warfarin. He's had no further bleeding. He is on an iron supplement. He has mild ankle edema which is chronic. He denies any shortness of breath or chest pain.  Current Outpatient Prescriptions on File Prior to Visit  Medication Sig Dispense Refill  . allopurinol (ZYLOPRIM) 300 MG tablet Take 300 mg by mouth daily.        Marland Kitchen aspirin 81 MG tablet Take 81 mg by mouth daily.        Marland Kitchen CALCIUM PO Take by mouth.        . digoxin (LANOXIN) 0.125 MG tablet Take 125 mcg by mouth daily.        Marland Kitchen docusate sodium (COLACE) 100 MG capsule Take 100 mg by mouth 1 dose over 24 hours.        . ergocalciferol (VITAMIN D2) 50000 UNITS capsule Take 50,000 Units by mouth every 30 (thirty) days.        . finasteride (PROSCAR) 5 MG tablet Take 5 mg by mouth daily.        . fosinopril (MONOPRIL) 40 MG tablet Take 40 mg by mouth daily. 1/2 tablet once daily       . metoprolol (LOPRESSOR) 50 MG tablet Take 25 mg by mouth 2 (two) times daily. 1/2 po bid      . Polysaccharide Iron Complex (NU-IRON PO) Take by mouth 2 (two) times daily.        . simvastatin (ZOCOR) 20 MG tablet Take 20 mg by mouth at bedtime.        Marland Kitchen warfarin (COUMADIN) 2.5 MG tablet Take 2.5 mg by mouth. Take as Directed       . amLODipine (NORVASC) 5 MG tablet Take 5 mg by mouth daily.        . Calcium Carbonate 1500 MG TABS Take by mouth 1 dose over 24 hours.        . dabigatran (PRADAXA) 150 MG CAPS Take 1 capsule (150 mg total) by mouth every 12 (twelve) hours.  180 capsule  3  . hydrocortisone (ANUSOL-HC) 25 MG suppository Place 25 mg rectally 2 (two) times daily as needed.         . hydrocortisone (ANUSOL-HC) 25 MG suppository Place 1 suppository (25 mg total) rectally every 12 (twelve) hours.  12 suppository  1    No Known Allergies  Past Medical History  Diagnosis Date  . Atrial fibrillation   . Atrial flutter   . Bradycardia   . Aortic aneurysm   . Unspecified essential hypertension   . Anal fissure   . Melanoma   . Hyperlipidemia   . PVD (peripheral vascular disease)   . AAA (abdominal aortic aneurysm)   . SSS (sick sinus syndrome)   . Arthritis   . Mild claudication     Past Surgical History  Procedure Date  . Melanoma excision     4 times  . Cataract extraction   . Hernia repair   . Pacemaker insertion   . Tonsillectomy   . Abdominal aortic aneurysm repair   . Insert / replace / remove pacemaker   . Aortic stent graft   .  US echocardiography 07/15/2008    EF 55-60%    History  Smoking status  . Former Smoker  . Types: Cigarettes  . Quit date: 02/12/1971  Smokeless tobacco  . Never Used    History  Alcohol Use  . Yes    occas    Family History  Problem Relation Age of Onset  . Breast cancer Daughter     granddaughter  . Celiac disease Neg Hx   . Cirrhosis Neg Hx   . Clotting disorder Neg Hx   . Colitis Neg Hx   . Colon cancer Neg Hx   . Colon polyps Neg Hx   . Crohn's disease Neg Hx   . Cystic fibrosis Neg Hx   . Esophageal cancer Neg Hx   . Heart disease Neg Hx   . Hemochromatosis Neg Hx   . Inflammatory bowel disease Neg Hx   . Irritable bowel syndrome Neg Hx   . Kidney disease Neg Hx   . Liver cancer Neg Hx   . Ovarian cancer Neg Hx   . Pancreatic cancer Neg Hx   . Prostate cancer Neg Hx   . Rectal cancer Neg Hx   . Stomach cancer Neg Hx   . Ulcerative colitis Neg Hx   . Uterine cancer Neg Hx   . Wilson's disease Neg Hx   . Diabetes Mother   . Liver disease Mother     Review of Systems: As noted in history of present illness. All other systems were reviewed and are negative.  Physical Exam: BP  144/88  Pulse 72  Ht 5\' 9"  (1.753 m)  Wt 181 lb (82.101 kg)  BMI 26.73 kg/m2 He is a pleasant elderly white male in no acute distress. He is normocephalic, atraumatic. Pupils are equal round and reactive to light and accommodation. Extraocular movements are full. Oropharynx is clear. Neck is supple no JVD, adenopathy, thyromegaly, or bruits. Lungs are clear. Cardiac exam reveals a regular rate and rhythm with a soft systolic ejection murmur. His pacemaker site is stable. Abdomen is soft and nontender without masses or bruits. He has trace to 1+ ankle edema. Pedal pulses are good. Skin is warm and dry. He is alert and oriented x3. Cranial nerves II through XII are intact. LABORATORY DATA:   Assessment / Plan:

## 2010-10-23 NOTE — Assessment & Plan Note (Signed)
Rate is currently well controlled and his anticoagulation has been resumed with warfarin. This is being followed at [facility].

## 2010-10-26 ENCOUNTER — Ambulatory Visit: Payer: Medicare Other | Admitting: Gastroenterology

## 2010-11-23 ENCOUNTER — Ambulatory Visit (INDEPENDENT_AMBULATORY_CARE_PROVIDER_SITE_OTHER): Payer: Medicare Other | Admitting: Internal Medicine

## 2010-11-23 ENCOUNTER — Encounter: Payer: Self-pay | Admitting: Internal Medicine

## 2010-11-23 DIAGNOSIS — I4891 Unspecified atrial fibrillation: Secondary | ICD-10-CM

## 2010-11-23 DIAGNOSIS — I495 Sick sinus syndrome: Secondary | ICD-10-CM

## 2010-11-23 LAB — PACEMAKER DEVICE OBSERVATION
AL AMPLITUDE: 2.0515 mv
BAMS-0001: 170 {beats}/min
RV LEAD THRESHOLD: 0.5 V
VENTRICULAR PACING PM: 2.17

## 2010-11-23 NOTE — Progress Notes (Signed)
The patient presents today for routine electrophysiology followup.  Since last being seen in our clinic, the patient reports doing reasonably well.  He has a rectal bleed for which he was hospitalized.  He has done better recently.  He has been switched from pradaxa to coumadin and is frustrated by labile INRs. Today, he denies symptoms of palpitations, chest pain, shortness of breath, orthopnea, PND, lower extremity edema, dizziness, presyncope, syncope, or neurologic sequela.  The patient feels that he is tolerating medications without difficulties and is otherwise without complaint today.   Past Medical History  Diagnosis Date  . Atrial fibrillation     paroxysmal  . Atrial flutter   . Aortic aneurysm   . Unspecified essential hypertension   . Anal fissure   . Melanoma   . Hyperlipidemia   . PVD (peripheral vascular disease)   . AAA (abdominal aortic aneurysm)   . SSS (sick sinus syndrome)     s/p PPM implantation  . Arthritis   . Mild claudication    Past Surgical History  Procedure Date  . Melanoma excision     4 times  . Cataract extraction   . Hernia repair   . Pacemaker insertion   . Tonsillectomy   . Abdominal aortic aneurysm repair   . Insert / replace / remove pacemaker   . Aortic stent graft   . US echocardiography 07/15/2008    EF 55-60%    Current Outpatient Prescriptions  Medication Sig Dispense Refill  . allopurinol (ZYLOPRIM) 300 MG tablet Take 300 mg by mouth daily.        Marland Kitchen CALCIUM CARBONATE PO Take by mouth. Taking 1.5 Grams Daily       . digoxin (LANOXIN) 0.125 MG tablet Take 125 mcg by mouth daily.        Marland Kitchen docusate sodium (COLACE) 100 MG capsule Take 100 mg by mouth 1 dose over 24 hours.        . ergocalciferol (VITAMIN D2) 50000 UNITS capsule Take 50,000 Units by mouth every 30 (thirty) days.        . finasteride (PROSCAR) 5 MG tablet Take 5 mg by mouth daily.        . fosinopril (MONOPRIL) 40 MG tablet Take 40 mg by mouth daily. 1/2 tablet once daily        . metoprolol (LOPRESSOR) 50 MG tablet Take 25 mg by mouth 2 (two) times daily. 1/2 po bid      . Polysaccharide Iron Complex (NU-IRON PO) Take by mouth 2 (two) times daily.        . simvastatin (ZOCOR) 20 MG tablet Take 20 mg by mouth at bedtime.        Marland Kitchen warfarin (COUMADIN) 2.5 MG tablet Take 2.5 mg by mouth. Take as Directed       . DISCONTD: Calcium Carbonate 1500 MG TABS Take by mouth 1 dose over 24 hours.          No Known Allergies  History   Social History  . Marital Status: Married    Spouse Name: N/A    Number of Children: 3  . Years of Education: N/A   Occupational History  . retired Hotel manager    Social History Main Topics  . Smoking status: Former Smoker    Types: Cigarettes    Quit date: 02/12/1971  . Smokeless tobacco: Never Used  . Alcohol Use: Yes     occasionally  . Drug Use: No  . Sexually Active: Not on file  Other Topics Concern  . Not on file   Social History Narrative  . No narrative on file    Family History  Problem Relation Age of Onset  . Breast cancer Daughter     granddaughter  . Diabetes Mother   . Liver disease Mother     ROS-  All systems are reviewed and are negative except as outlined in the HPI above   Physical Exam: Filed Vitals:   11/23/10 1145  BP: 152/90  Pulse: 71  Height: 5\' 9"  (1.753 m)  Weight: 175 lb (79.379 kg)    GEN- The patient is well appearing, alert and oriented x 3 today.   Head- normocephalic, atraumatic Eyes-  Sclera clear, conjunctiva pink Ears- hearing intact Oropharynx- clear Neck- supple, no JVP Lymph- no cervical lymphadenopathy Lungs- Clear to ausculation bilaterally, normal work of breathing Chest- pacemaker pocket is well healed Heart- Regular rate and rhythm, no murmurs, rubs or gallops, PMI not laterally displaced GI- soft, NT, ND, + BS Extremities- no clubbing, cyanosis, or edema  Pacemaker interrogation- reviewed in detail today,  See PACEART report  Assessment and Plan:

## 2010-11-23 NOTE — Assessment & Plan Note (Signed)
He has a h/o atrial fibrillation and atrial flutter.  Pacemaker interrogation today reveals all episodes of afib are <12 hours and very few episodes since last interrogation 5/12. Continue coumadin at this time, though I think that it would be reasonable to retry pradaxa or consider xarelto (with very close CrCl follow-up) if no further GI bleeding occurs over the next few months. No changes today.

## 2010-11-23 NOTE — Assessment & Plan Note (Signed)
Normal pacemaker function See Pace Art report No changes today  

## 2010-11-23 NOTE — Patient Instructions (Signed)
Your physician wants you to follow-up in: 6 months with device clinic and 12 months with Dr Allred You will receive a reminder letter in the mail two months in advance. If you don't receive a letter, please call our office to schedule the follow-up appointment.  

## 2010-11-27 ENCOUNTER — Encounter: Payer: Self-pay | Admitting: Gastroenterology

## 2010-11-27 ENCOUNTER — Ambulatory Visit (INDEPENDENT_AMBULATORY_CARE_PROVIDER_SITE_OTHER): Payer: Medicare Other | Admitting: Gastroenterology

## 2010-11-27 VITALS — BP 170/90 | HR 73 | Ht 69.0 in | Wt 172.8 lb

## 2010-11-27 DIAGNOSIS — K648 Other hemorrhoids: Secondary | ICD-10-CM

## 2010-11-27 NOTE — Patient Instructions (Signed)
Follow up as needed

## 2010-11-27 NOTE — Assessment & Plan Note (Signed)
Status post band ligation with excellent response 

## 2010-11-27 NOTE — Progress Notes (Signed)
History of Present Illness:  Mr. Nibert has returned following band ligation of his hemorrhoids. This was done in July. Although he held predaxa for 3 days prior to the procedure he had a postprocedure bleed requiring hospitalization. Since that time he has felt well. He is without abdominal or rectal pain or bleeding. Bowels are little bit irregular.    Review of Systems: Pertinent positive and negative review of systems were noted in the above HPI section. All other review of systems were otherwise negative.    Current Medications, Allergies, Past Medical History, Past Surgical History, Family History and Social History were reviewed in [employer] [facility] electronic medical record  Vital signs were reviewed in today's medical record. Physical Exam: General: Well developed , well nourished, no acute distress

## 2010-12-27 ENCOUNTER — Encounter: Payer: Medicare Other | Admitting: Internal Medicine

## 2011-01-09 ENCOUNTER — Emergency Department (HOSPITAL_COMMUNITY): Payer: Medicare Other

## 2011-01-09 ENCOUNTER — Inpatient Hospital Stay (HOSPITAL_COMMUNITY)
Admission: EM | Admit: 2011-01-09 | Discharge: 2011-01-15 | DRG: 238 | Disposition: A | Discharge status: Home / Self Care | Payer: Medicare Other | Attending: Vascular Surgery | Admitting: Vascular Surgery

## 2011-01-09 ENCOUNTER — Encounter (HOSPITAL_COMMUNITY): Payer: Self-pay

## 2011-01-09 ENCOUNTER — Encounter (HOSPITAL_COMMUNITY)
Admission: EM | Disposition: A | Discharge status: Home / Self Care | Payer: Self-pay | Source: Home / Self Care | Attending: Vascular Surgery

## 2011-01-09 ENCOUNTER — Encounter (HOSPITAL_COMMUNITY): Payer: Self-pay | Admitting: Nurse Practitioner

## 2011-01-09 DIAGNOSIS — I713 Abdominal aortic aneurysm, ruptured, unspecified: Principal | ICD-10-CM | POA: Diagnosis present

## 2011-01-09 DIAGNOSIS — I495 Sick sinus syndrome: Secondary | ICD-10-CM | POA: Insufficient documentation

## 2011-01-09 DIAGNOSIS — Z950 Presence of cardiac pacemaker: Secondary | ICD-10-CM

## 2011-01-09 DIAGNOSIS — Z85820 Personal history of malignant melanoma of skin: Secondary | ICD-10-CM

## 2011-01-09 DIAGNOSIS — E785 Hyperlipidemia, unspecified: Secondary | ICD-10-CM | POA: Diagnosis present

## 2011-01-09 DIAGNOSIS — I4891 Unspecified atrial fibrillation: Secondary | ICD-10-CM | POA: Insufficient documentation

## 2011-01-09 DIAGNOSIS — I10 Essential (primary) hypertension: Secondary | ICD-10-CM | POA: Insufficient documentation

## 2011-01-09 DIAGNOSIS — Z87891 Personal history of nicotine dependence: Secondary | ICD-10-CM

## 2011-01-09 DIAGNOSIS — I4892 Unspecified atrial flutter: Secondary | ICD-10-CM | POA: Diagnosis present

## 2011-01-09 HISTORY — PX: ENDOVASCULAR STENT INSERTION: SHX5161

## 2011-01-09 LAB — COMPREHENSIVE METABOLIC PANEL
ALT: 12 U/L (ref 0–53)
ALT: 8 U/L (ref 0–53)
AST: 17 U/L (ref 0–37)
Albumin: 4 g/dL (ref 3.5–5.2)
Alkaline Phosphatase: 42 U/L (ref 39–117)
CO2: 23 mEq/L (ref 19–32)
Calcium: 9.3 mg/dL (ref 8.4–10.5)
Calcium: 8.2 mg/dL — ABNORMAL LOW (ref 8.4–10.5)
Chloride: 104 mEq/L (ref 96–112)
GFR calc Af Amer: 85 mL/min — ABNORMAL LOW (ref >90)
GFR calc non Af Amer: 73 mL/min — ABNORMAL LOW (ref >90)
Glucose, Bld: 165 mg/dL — ABNORMAL HIGH (ref 70–99)
Sodium: 138 mEq/L (ref 135–145)
Sodium: 135 mEq/L (ref 135–145)
Total Bilirubin: 0.8 mg/dL (ref 0.3–1.2)
Total Protein: 7.2 g/dL (ref 6.0–8.3)

## 2011-01-09 LAB — DIFFERENTIAL
Basophils Absolute: 0.1 10*3/uL (ref 0.0–0.1)
Basophils Relative: 1 % (ref 0–1)
Monocytes Relative: 5 % (ref 3–12)
Neutro Abs: 8.6 10*3/uL — ABNORMAL HIGH (ref 1.7–7.7)
Neutrophils Relative %: 80 % — ABNORMAL HIGH (ref 43–77)

## 2011-01-09 LAB — POCT I-STAT 7, (LYTES, BLD GAS, ICA,H+H)
Acid-base deficit: 1 mmol/L (ref 0.0–2.0)
Calcium, Ion: 1.17 mmol/L (ref 1.12–1.32)
Calcium, Ion: 1.23 mmol/L (ref 1.12–1.32)
Hemoglobin: 11.9 g/dL — ABNORMAL LOW (ref 13.0–17.0)
O2 Saturation: 100 %
O2 Saturation: 100 %
Patient temperature: 35.6
Potassium: 5.1 mEq/L (ref 3.5–5.1)
Sodium: 137 mEq/L (ref 135–145)
TCO2: 24 mmol/L (ref 0–100)
pCO2 arterial: 43.9 mmHg (ref 35.0–45.0)
pO2, Arterial: 503 mmHg — ABNORMAL HIGH (ref 80.0–100.0)

## 2011-01-09 LAB — URINALYSIS, ROUTINE W REFLEX MICROSCOPIC
Bilirubin Urine: NEGATIVE
Specific Gravity, Urine: 1.023 (ref 1.005–1.030)
pH: 7.5 (ref 5.0–8.0)

## 2011-01-09 LAB — CBC
HCT: 36.7 % — ABNORMAL LOW (ref 39.0–52.0)
Hemoglobin: 15.6 g/dL (ref 13.0–17.0)
Hemoglobin: 12.3 g/dL — ABNORMAL LOW (ref 13.0–17.0)
MCH: 30.6 pg (ref 26.0–34.0)
MCHC: 33.7 g/dL (ref 30.0–36.0)
MCHC: 33.5 g/dL (ref 30.0–36.0)
RDW: 15.5 % (ref 11.5–15.5)

## 2011-01-09 LAB — POCT I-STAT, CHEM 8
Chloride: 105 mEq/L (ref 96–112)
Glucose, Bld: 148 mg/dL — ABNORMAL HIGH (ref 70–99)
HCT: 49 % (ref 39.0–52.0)
Hemoglobin: 16.7 g/dL (ref 13.0–17.0)
Potassium: 4.7 mEq/L (ref 3.5–5.1)
Sodium: 139 mEq/L (ref 135–145)

## 2011-01-09 LAB — PROTIME-INR
INR: 1.76 — ABNORMAL HIGH (ref 0.00–1.49)
INR: 2.07 — ABNORMAL HIGH (ref 0.00–1.49)

## 2011-01-09 LAB — PREPARE RBC (CROSSMATCH)

## 2011-01-09 LAB — POCT I-STAT TROPONIN I

## 2011-01-09 SURGERY — ENDOVASCULAR STENT GRAFT INSERTION
Anesthesia: General | Site: Groin | Laterality: Right | Wound class: Clean

## 2011-01-09 MED ORDER — HETASTARCH-ELECTROLYTES 6 % IV SOLN
INTRAVENOUS | Status: DC | PRN
  Administered 2011-01-09: 14:00:00 via INTRAVENOUS

## 2011-01-09 MED ORDER — MORPHINE SULFATE 4 MG/ML IJ SOLN
4.0000 mg | Freq: Once | INTRAMUSCULAR | Status: AC
  Administered 2011-01-09: 4 mg via INTRAVENOUS
  Filled 2011-01-09: qty 1

## 2011-01-09 MED ORDER — PROTAMINE SULFATE 10 MG/ML IV SOLN
INTRAVENOUS | Status: DC | PRN
  Administered 2011-01-09: 20 mg via INTRAVENOUS
  Administered 2011-01-09 (×2): 10 mg via INTRAVENOUS

## 2011-01-09 MED ORDER — GLYCOPYRROLATE 0.2 MG/ML IJ SOLN
INTRAMUSCULAR | Status: DC | PRN
  Administered 2011-01-09: .6 mg via INTRAVENOUS

## 2011-01-09 MED ORDER — LABETALOL HCL 5 MG/ML IV SOLN: 10.0000 mg | INTRAVENOUS | Status: DC | PRN

## 2011-01-09 MED ORDER — BISACODYL 10 MG RE SUPP
10.0000 mg | Freq: Every day | RECTAL | Status: DC | PRN
  Administered 2011-01-12 – 2011-01-14 (×2): 10 mg via RECTAL
  Filled 2011-01-09 (×2): qty 1

## 2011-01-09 MED ORDER — ACETAMINOPHEN 325 MG PO TABS: 325.0000 mg | ORAL_TABLET | ORAL | Status: DC | PRN

## 2011-01-09 MED ORDER — ACETAMINOPHEN 650 MG RE SUPP: 325.0000 mg | RECTAL | Status: DC | PRN

## 2011-01-09 MED ORDER — DEXTROSE 5 % IV SOLN
1.5000 g | INTRAVENOUS | Status: DC | PRN
  Administered 2011-01-09: 1.5 g via INTRAVENOUS

## 2011-01-09 MED ORDER — SODIUM CHLORIDE 0.9 % IV SOLN
Freq: Once | INTRAVENOUS | Status: AC
  Administered 2011-01-09: 10:00:00 via INTRAVENOUS

## 2011-01-09 MED ORDER — DOPAMINE-DEXTROSE 3.2-5 MG/ML-% IV SOLN: 3.0000 ug/kg/min | INTRAVENOUS | Status: DC

## 2011-01-09 MED ORDER — VECURONIUM BROMIDE 10 MG IV SOLR
INTRAVENOUS | Status: DC | PRN
  Administered 2011-01-09: 4 mg via INTRAVENOUS
  Administered 2011-01-09: 1 mg via INTRAVENOUS

## 2011-01-09 MED ORDER — SODIUM CHLORIDE 0.9 % IR SOLN
Status: DC | PRN
  Administered 2011-01-09: 14:00:00

## 2011-01-09 MED ORDER — SODIUM CHLORIDE 0.9 % IV SOLN
10.0000 mg | INTRAVENOUS | Status: DC | PRN
  Administered 2011-01-09: 100 ug/min via INTRAVENOUS

## 2011-01-09 MED ORDER — SODIUM CHLORIDE 0.9 % IV BOLUS (SEPSIS)
500.0000 mL | Freq: Once | INTRAVENOUS | Status: AC
  Administered 2011-01-09: 10:00:00 via INTRAVENOUS

## 2011-01-09 MED ORDER — KCL IN DEXTROSE-NACL 20-5-0.45 MEQ/L-%-% IV SOLN
INTRAVENOUS | Status: DC
  Administered 2011-01-09 – 2011-01-11 (×3): via INTRAVENOUS
  Filled 2011-01-09 (×6): qty 1000

## 2011-01-09 MED ORDER — SODIUM CHLORIDE 0.9 % IR SOLN
Status: DC | PRN
  Administered 2011-01-09: 1

## 2011-01-09 MED ORDER — FENTANYL CITRATE 0.05 MG/ML IJ SOLN
INTRAMUSCULAR | Status: DC | PRN
  Administered 2011-01-09 (×5): 50 ug via INTRAVENOUS
  Administered 2011-01-09: 250 ug via INTRAVENOUS

## 2011-01-09 MED ORDER — HYDRALAZINE HCL 20 MG/ML IJ SOLN
10.0000 mg | INTRAMUSCULAR | Status: DC | PRN
  Administered 2011-01-09 – 2011-01-10 (×2): 10 mg via INTRAVENOUS
  Filled 2011-01-09 (×3): qty 0.5

## 2011-01-09 MED ORDER — LACTATED RINGERS IV SOLN
INTRAVENOUS | Status: DC | PRN
  Administered 2011-01-09 (×2): via INTRAVENOUS

## 2011-01-09 MED ORDER — MORPHINE SULFATE 2 MG/ML IJ SOLN
2.0000 mg | INTRAMUSCULAR | Status: DC | PRN
  Administered 2011-01-09 – 2011-01-15 (×8): 2 mg via INTRAVENOUS
  Filled 2011-01-09 (×8): qty 1

## 2011-01-09 MED ORDER — ONDANSETRON HCL 4 MG/2ML IJ SOLN: 4.0000 mg | Freq: Four times a day (QID) | INTRAMUSCULAR | Status: DC | PRN

## 2011-01-09 MED ORDER — SODIUM CHLORIDE 0.9 % IV SOLN
INTRAVENOUS | Status: DC
  Administered 2011-01-09: 10:00:00 via INTRAVENOUS

## 2011-01-09 MED ORDER — MIDAZOLAM HCL 5 MG/5ML IJ SOLN
INTRAMUSCULAR | Status: DC | PRN
  Administered 2011-01-09: 1 mg via INTRAVENOUS

## 2011-01-09 MED ORDER — HEPARIN SODIUM (PORCINE) 1000 UNIT/ML IJ SOLN
INTRAMUSCULAR | Status: DC | PRN
  Administered 2011-01-09: 8000 [IU] via INTRAVENOUS

## 2011-01-09 MED ORDER — DEXTROSE 5 % IV SOLN
1.5000 g | Freq: Two times a day (BID) | INTRAVENOUS | Status: AC
  Administered 2011-01-09 – 2011-01-10 (×2): 1.5 g via INTRAVENOUS
  Filled 2011-01-09 (×2): qty 1.5

## 2011-01-09 MED ORDER — DROPERIDOL 2.5 MG/ML IJ SOLN
0.6250 mg | Freq: Once | INTRAMUSCULAR | Status: AC
  Administered 2011-01-09: 0.625 mg via INTRAVENOUS

## 2011-01-09 MED ORDER — LACTATED RINGERS IV SOLN
INTRAVENOUS | Status: DC | PRN
  Administered 2011-01-09: 12:00:00 via INTRAVENOUS

## 2011-01-09 MED ORDER — PHENOL 1.4 % MT LIQD: 1.0000 | OROMUCOSAL | Status: DC | PRN

## 2011-01-09 MED ORDER — MORPHINE SULFATE 4 MG/ML IJ SOLN: 4.0000 mg | Freq: Once | INTRAMUSCULAR | Status: DC

## 2011-01-09 MED ORDER — SODIUM CHLORIDE 0.9 % IV SOLN: 500.0000 mL | Freq: Once | INTRAVENOUS | Status: AC | PRN

## 2011-01-09 MED ORDER — IOHEXOL 350 MG/ML SOLN
80.0000 mL | Freq: Once | INTRAVENOUS | Status: AC | PRN
  Administered 2011-01-09: 80 mL via INTRAVENOUS

## 2011-01-09 MED ORDER — ONDANSETRON HCL 4 MG/2ML IJ SOLN
4.0000 mg | Freq: Once | INTRAMUSCULAR | Status: AC
  Administered 2011-01-09: 4 mg via INTRAVENOUS
  Filled 2011-01-09: qty 2

## 2011-01-09 MED ORDER — SODIUM CHLORIDE 0.9 % IV SOLN
INTRAVENOUS | Status: DC | PRN
  Administered 2011-01-09: 11:00:00 via INTRAVENOUS

## 2011-01-09 MED ORDER — ONDANSETRON HCL 4 MG/2ML IJ SOLN
INTRAMUSCULAR | Status: DC | PRN
  Administered 2011-01-09: 4 mg via INTRAVENOUS

## 2011-01-09 MED ORDER — MORPHINE SULFATE 2 MG/ML IJ SOLN
INTRAMUSCULAR | Status: AC
  Filled 2011-01-09: qty 1

## 2011-01-09 MED ORDER — MORPHINE SULFATE 2 MG/ML IJ SOLN
2.0000 mg | Freq: Once | INTRAMUSCULAR | Status: AC
  Administered 2011-01-09: 2 mg via INTRAVENOUS

## 2011-01-09 MED ORDER — NEOSTIGMINE METHYLSULFATE 1 MG/ML IJ SOLN
INTRAMUSCULAR | Status: DC | PRN
  Administered 2011-01-09: 5 mg via INTRAVENOUS

## 2011-01-09 MED ORDER — FAMOTIDINE IN NACL 20-0.9 MG/50ML-% IV SOLN
20.0000 mg | Freq: Two times a day (BID) | INTRAVENOUS | Status: DC
  Administered 2011-01-09 – 2011-01-11 (×4): 20 mg via INTRAVENOUS
  Filled 2011-01-09 (×4): qty 50

## 2011-01-09 MED ORDER — HYDROMORPHONE HCL PF 1 MG/ML IJ SOLN: 0.2500 mg | INTRAMUSCULAR | Status: DC | PRN

## 2011-01-09 MED ORDER — HYDROMORPHONE HCL PF 1 MG/ML IJ SOLN
0.2500 mg | INTRAMUSCULAR | Status: DC | PRN
  Administered 2011-01-09 (×5): 0.25 mg via INTRAVENOUS

## 2011-01-09 MED ORDER — MAGNESIUM SULFATE 40 MG/ML IJ SOLN: 2.0000 g | Freq: Once | INTRAMUSCULAR | Status: AC | PRN

## 2011-01-09 MED ORDER — ONDANSETRON HCL 4 MG/2ML IJ SOLN
4.0000 mg | Freq: Four times a day (QID) | INTRAMUSCULAR | Status: AC | PRN
  Administered 2011-01-09: 4 mg via INTRAVENOUS

## 2011-01-09 MED ORDER — ROCURONIUM BROMIDE 100 MG/10ML IV SOLN
INTRAVENOUS | Status: DC | PRN
  Administered 2011-01-09: 50 mg via INTRAVENOUS

## 2011-01-09 MED ORDER — LABETALOL HCL 5 MG/ML IV SOLN
INTRAVENOUS | Status: DC | PRN
  Administered 2011-01-09 (×2): 10 mg via INTRAVENOUS

## 2011-01-09 MED ORDER — IODIXANOL 320 MG/ML IV SOLN
INTRAVENOUS | Status: DC | PRN
  Administered 2011-01-09: 159 mL via INTRAVENOUS

## 2011-01-09 MED ORDER — PROPOFOL 10 MG/ML IV EMUL
INTRAVENOUS | Status: DC | PRN
  Administered 2011-01-09: 100 mg via INTRAVENOUS

## 2011-01-09 MED ORDER — MORPHINE SULFATE 4 MG/ML IJ SOLN
INTRAMUSCULAR | Status: AC
  Administered 2011-01-09: 4 mg
  Filled 2011-01-09: qty 1

## 2011-01-09 MED ORDER — METOPROLOL TARTRATE 1 MG/ML IV SOLN
2.0000 mg | INTRAVENOUS | Status: AC | PRN
  Administered 2011-01-09 – 2011-01-10 (×2): 5 mg via INTRAVENOUS
  Filled 2011-01-09 (×2): qty 5

## 2011-01-09 SURGICAL SUPPLY — 79 items
BAG DECANTER FOR FLEXI CONT (MISCELLANEOUS) IMPLANT
BAG SNAP BAND KOVER 36X36 (MISCELLANEOUS) ×2 IMPLANT
BALLN CODA OCL 2-10-35-140-46 (BALLOONS) ×2
BALLN CODA OCL 2-9.0-35-120-3 (BALLOONS)
BALLOON COD OCL 2-10-35-140-46 (BALLOONS) ×1 IMPLANT
BALLOON COD OCL 2-9.0-35-120-3 (BALLOONS) IMPLANT
BENZOIN TINCTURE PRP APPL 2/3 (GAUZE/BANDAGES/DRESSINGS) ×2 IMPLANT
CANISTER SUCTION 2500CC (MISCELLANEOUS) ×2 IMPLANT
CATH ACCU-VU SIZ PIG 5F 100CM (CATHETERS) ×2 IMPLANT
CATH ACCU-VU SIZ PIG 5F 70CM (CATHETERS) ×2 IMPLANT
CLIP TI MEDIUM 24 (CLIP) ×2 IMPLANT
CLIP TI WIDE RED SMALL 24 (CLIP) ×2 IMPLANT
CLOTH BEACON ORANGE TIMEOUT ST (SAFETY) ×2 IMPLANT
COMPONENT AAA ANCI ESBE 36-50 (Endovascular Graft) ×2 IMPLANT
COVER MAYO STAND STRL (DRAPES) ×2 IMPLANT
COVER SURGICAL LIGHT HANDLE (MISCELLANEOUS) ×4 IMPLANT
DERMABOND ADVANCED (GAUZE/BANDAGES/DRESSINGS) ×1
DERMABOND ADVANCED .7 DNX12 (GAUZE/BANDAGES/DRESSINGS) ×1 IMPLANT
DEVICE TORQUE 50000 (MISCELLANEOUS) ×2 IMPLANT
DRAIN CHANNEL 10F 3/8 F FF (DRAIN) IMPLANT
DRAIN CHANNEL 10M FLAT 3/4 FLT (DRAIN) IMPLANT
DRAPE C-ARM 42X72 X-RAY (DRAPES) ×4 IMPLANT
DRAPE TABLE COVER HEAVY DUTY (DRAPES) ×2 IMPLANT
ELECT CAUTERY BLADE 6.4 (BLADE) ×2 IMPLANT
ELECT REM PT RETURN 9FT ADLT (ELECTROSURGICAL) ×4
ELECTRODE REM PT RTRN 9FT ADLT (ELECTROSURGICAL) ×2 IMPLANT
EVACUATOR 3/16  PVC DRAIN (DRAIN)
EVACUATOR 3/16 PVC DRAIN (DRAIN) IMPLANT
EVACUATOR SILICONE 100CC (DRAIN) IMPLANT
GLOVE BIO SURGEON STRL SZ 6.5 (GLOVE) ×2 IMPLANT
GLOVE BIO SURGEON STRL SZ7.5 (GLOVE) ×6 IMPLANT
GLOVE BIOGEL PI IND STRL 6.5 (GLOVE) ×1 IMPLANT
GLOVE BIOGEL PI IND STRL 7.0 (GLOVE) ×6 IMPLANT
GLOVE BIOGEL PI INDICATOR 6.5 (GLOVE) ×1
GLOVE BIOGEL PI INDICATOR 7.0 (GLOVE) ×6
GLOVE ECLIPSE 7.0 STRL STRAW (GLOVE) ×4 IMPLANT
GLOVE SS BIOGEL STRL SZ 7.5 (GLOVE) ×1 IMPLANT
GLOVE SUPERSENSE BIOGEL SZ 7.5 (GLOVE) ×1
GOWN STRL NON-REIN LRG LVL3 (GOWN DISPOSABLE) ×8 IMPLANT
GRAFT ZENITH AAA RENU 36X116 (Endovascular Graft) ×2 IMPLANT
GUIDEWIRE ANGLED .035X150CM (WIRE) ×2 IMPLANT
GUIDEWIRE WHOLEY H TORQU 260CM (WIRE) ×2 IMPLANT
INTRODUCER AVANTI 5FR (MISCELLANEOUS) ×2 IMPLANT
KIT BASIN OR (CUSTOM PROCEDURE TRAY) ×2 IMPLANT
KIT ROOM TURNOVER OR (KITS) ×2 IMPLANT
LOOP VESSEL MINI RED (MISCELLANEOUS) ×2 IMPLANT
NEEDLE PERC 18GX7CM (NEEDLE) ×2 IMPLANT
NS IRRIG 1000ML POUR BTL (IV SOLUTION) ×4 IMPLANT
PACK AORTA (CUSTOM PROCEDURE TRAY) ×2 IMPLANT
PAD ARMBOARD 7.5X6 YLW CONV (MISCELLANEOUS) ×2 IMPLANT
PENCIL BUTTON HOLSTER BLD 10FT (ELECTRODE) ×2 IMPLANT
SET MICROPUNCTURE 5F STIFF (MISCELLANEOUS) ×2 IMPLANT
SHEATH AVANTI 11CM 8FR (MISCELLANEOUS) ×2 IMPLANT
SHEATH BRITE TIP 8FR 23CM (MISCELLANEOUS) ×2 IMPLANT
SPONGE GAUZE 4X4 FOR O.R. (GAUZE/BANDAGES/DRESSINGS) ×2 IMPLANT
STAPLER VISISTAT 35W (STAPLE) IMPLANT
STOPCOCK MORSE 400PSI 3WAY (MISCELLANEOUS) ×2 IMPLANT
STRIP CLOSURE SKIN 1/2X4 (GAUZE/BANDAGES/DRESSINGS) ×2 IMPLANT
SUT ETHILON 3 0 PS 1 (SUTURE) ×2 IMPLANT
SUT PROLENE 5 0 C 1 24 (SUTURE) IMPLANT
SUT PROLENE 6 0 CC (SUTURE) ×10 IMPLANT
SUT VIC AB 2-0 CTX 36 (SUTURE) ×4 IMPLANT
SUT VIC AB 3-0 SH 27 (SUTURE) ×4
SUT VIC AB 3-0 SH 27X BRD (SUTURE) ×4 IMPLANT
SUT VICRYL 4-0 PS2 18IN ABS (SUTURE) ×4 IMPLANT
SYR 20CC LL (SYRINGE) ×4 IMPLANT
SYR 30ML LL (SYRINGE) ×2 IMPLANT
SYR 5ML LL (SYRINGE) ×2 IMPLANT
SYR MEDRAD MARK V 150ML (SYRINGE) ×4 IMPLANT
SYRINGE 10CC LL (SYRINGE) ×6 IMPLANT
TAPE CLOTH SURG 4X10 WHT LF (GAUZE/BANDAGES/DRESSINGS) ×2 IMPLANT
TOWEL OR 17X24 6PK STRL BLUE (TOWEL DISPOSABLE) ×4 IMPLANT
TOWEL OR 17X26 10 PK STRL BLUE (TOWEL DISPOSABLE) ×4 IMPLANT
TRAY FOLEY CATH 14FRSI W/METER (CATHETERS) ×2 IMPLANT
TUBING HIGH PRESSURE 120CM (CONNECTOR) ×4 IMPLANT
WATER STERILE IRR 1000ML POUR (IV SOLUTION) ×2 IMPLANT
WIRE AMPLATZ SS-J .035X260CM (WIRE) ×4 IMPLANT
WIRE BENTSON .035X145CM (WIRE) ×4 IMPLANT
WIRE STIFF LUNDERQUIST 260MM (WIRE) ×2 IMPLANT

## 2011-01-09 NOTE — OR Nursing (Signed)
Total c-arm time 9 minutes 54 seconds

## 2011-01-09 NOTE — Anesthesia Postprocedure Evaluation (Signed)
  Anesthesia Post-op Note  Patient: [name] [name]  Procedure(s) Performed:  ENDOVASCULAR STENT GRAFT INSERTION - With Left  Brachial artery cut-down  Patient Location: PACU  Anesthesia Type: General  Level of Consciousness: sedated  Airway and Oxygen Therapy: Patient connected to nasal cannula oxygen  Post-op Pain: none  Post-op Assessment: Post-op Vital signs reviewed  Post-op Vital Signs: stable  Complications: No apparent anesthesia complications

## 2011-01-09 NOTE — ED Notes (Signed)
MD at bedside. With US.

## 2011-01-09 NOTE — Addendum Note (Signed)
Addendum  created 01/09/11 1813 by Melonie [place], MD   Modules edited:Orders

## 2011-01-09 NOTE — Plan of Care (Signed)
Problem: Phase I Progression Outcomes Goal: Initial discharge plan identified Outcome: Completed/Met Date Met:  01/09/11 Home with wife

## 2011-01-09 NOTE — Anesthesia Preprocedure Evaluation (Addendum)
Anesthesia Evaluation  Patient identified by MRN, date of birth, ID band Patient awake    Reviewed: Allergy & Precautions, H&P , NPO status , Patient's Chart, lab work & pertinent test results  History of Anesthesia Complications Negative for: history of anesthetic complications  Airway Mallampati: II  Neck ROM: full    Dental   Pulmonary former smoker Quit smoking 1973   Pulmonary exam normal       Cardiovascular hypertension, Pt. on medications and Pt. on home beta blockers + Peripheral Vascular Disease (AAA) + dysrhythmias Atrial Fibrillation Irregular Normal    Neuro/Psych    GI/Hepatic GERD-  ,  Endo/Other    Renal/GU      Musculoskeletal   Abdominal   Peds  Hematology   Anesthesia Other Findings   Reproductive/Obstetrics                         Anesthesia Physical Anesthesia Plan  ASA: III and Emergent  Anesthesia Plan: General   Post-op Pain Management:    Induction: Intravenous  Airway Management Planned: Oral ETT  Additional Equipment: Arterial line and CVP  Intra-op Plan:   Post-operative Plan: Possible Post-op intubation/ventilation  Informed Consent: I have reviewed the patients History and Physical, chart, labs and discussed the procedure including the risks, benefits and alternatives for the proposed anesthesia with the patient or authorized representative who has indicated his/her understanding and acceptance.     Plan Discussed with: CRNA and Surgeon  Anesthesia Plan Comments:        Anesthesia Quick Evaluation

## 2011-01-09 NOTE — ED Notes (Signed)
Chem 8 lab test results handed to Effie Shy, MD

## 2011-01-09 NOTE — H&P (Signed)
[facility] [place] HISTORY AND PHYSICAL   History of Present Illness:  Patient is a 77 y.o. year old male who presents for evaluation of abdominal aortic aneurysm. He had a prior stent graft repair several years ago and presented to ER today with ruptured AAA by CT.  Prior Fem Fem bypass as well. So far hemodynamically stable.  Has flank pain for several hours.    Past Medical History  Diagnosis Date  . Atrial fibrillation     paroxysmal  . Atrial flutter   . Aortic aneurysm   . Unspecified essential hypertension   . Anal fissure   . Melanoma   . Hyperlipidemia   . PVD (peripheral vascular disease)   . AAA (abdominal aortic aneurysm)   . SSS (sick sinus syndrome)     s/p PPM implantation  . Arthritis   . Mild claudication     Past Surgical History  Procedure Date  . Melanoma excision     4 times  . Cataract extraction   . Hernia repair   . Pacemaker insertion   . Tonsillectomy   . Abdominal aortic aneurysm repair   . Insert / replace / remove pacemaker   . Aortic stent graft   . US echocardiography 07/15/2008    EF 55-60%     Social History History  Substance Use Topics  . Smoking status: Former Smoker    Types: Cigarettes    Quit date: 02/12/1971  . Smokeless tobacco: Never Used  . Alcohol Use: Yes     occasionally    Family History Family History  Problem Relation Age of Onset  . Breast cancer Daughter     granddaughter  . Diabetes Mother   . Liver disease Mother     Allergies  No Known Allergies   Current Facility-Administered Medications  Medication Dose Route Frequency Provider Last Rate Last Dose  . 0.9 %  sodium chloride infusion   Intravenous Once Hurman Horn, MD 30 mL/hr at 01/09/11 [id number]    . 0.9 %  sodium chloride infusion   Intravenous Continuous Flint Melter, MD 125 mL/hr at 01/09/11 1015    . iohexol (OMNIPAQUE) 350 MG/ML injection 80 mL  80 mL Intravenous Once PRN Medication Radiologist   80 mL at 01/09/11  1052  . morphine 2 MG/ML injection 2 mg  2 mg Intravenous Once Flint Melter, MD   2 mg at 01/09/11 1024  . morphine 4 MG/ML injection 4 mg  4 mg Intravenous Once Flint Melter, MD   4 mg at 01/09/11 1012  . morphine 4 MG/ML injection 4 mg  4 mg Intravenous Once Flint Melter, MD      . morphine 4 MG/ML injection        4 mg at 01/09/11 1059  . ondansetron (ZOFRAN) injection 4 mg  4 mg Intravenous Once Flint Melter, MD   4 mg at 01/09/11 1012  . sodium chloride 0.9 % bolus 500 mL  500 mL Intravenous Once Flint Melter, MD       Facility-Administered Medications Ordered in Other Encounters  Medication Dose Route Frequency Provider Last Rate Last Dose  . 0.9 %  sodium chloride infusion    Continuous PRN Eyvonne Mechanic Maness      . fentaNYL (SUBLIMAZE) injection    PRN Eyvonne Mechanic Maness   50 mcg at 01/09/11 1217  . labetalol (NORMODYNE,TRANDATE) injection    PRN Eyvonne Mechanic Maness   10 mg  at 01/09/11 1200  . lactated ringers infusion    Continuous PRN Eyvonne Mechanic Maness      . lactated ringers infusion    Continuous PRN Eyvonne Mechanic Maness      . lactated ringers infusion    Continuous PRN Eyvonne Mechanic Maness      . midazolam (VERSED) 5 MG/5ML injection    PRN Eyvonne Mechanic Maness   1 mg at 01/09/11 1150  . ondansetron (ZOFRAN) injection    PRN Eyvonne Mechanic Maness   4 mg at 01/09/11 1150    ROS: Not obtained secondary to emergency  Physical Examination  Filed Vitals:   01/09/11 0955 01/09/11 1030 01/09/11 1115 01/09/11 1130  BP: 206/94 194/93 194/104 204/105  Pulse:  70 74 78  Temp:      TempSrc:      Resp:  15 16 15   SpO2:  100% 100% 98%    There is no height or weight on file to calculate BMI.  General:  Alert and oriented, no acute distress HEENT: Normal Neck: No bruit or JVD Pulmonary: Clear to auscultation bilaterally Cardiac: Regular Rate and Rhythm without murmur Gastrointestinal: Soft, mild tenderness Skin: No rash Extremity Pulses:  2+ radial, brachial,  femoral,  Musculoskeletal: No deformity or edema  Neurologic: Upper and lower extremity motor 5/5 and symmetric  DATA: ruptured AAA 31 mm neck proximal type one leak patent fem fem   ASSESSMENT: Ruptured AAA   PLAN:Renu stent graft repair to OR emergently   Fabienne Bruns, MD [facility] [place] Office: [phone] Pager: [phone]

## 2011-01-09 NOTE — ED Provider Notes (Addendum)
History     CSN: [id number] Arrival date & time: 01/09/2011  9:12 AM   First MD Initiated Contact with Patient 01/09/11 [phone]      Chief Complaint  Patient presents with  . Abdominal Pain    EMS reports pt with C/O pain in RLQ, Hx of AAA, stent in the AAA 2 years ago, BP 182/98,    Level V caveat: Severe illness. (Consider location/radiation/quality/duration/timing/severity/associated sxs/prior treatment) Patient is a 77 y.o. male presenting with abdominal pain. The history is provided by the patient.  Abdominal Pain The primary symptoms of the illness include abdominal pain and nausea. The primary symptoms of the illness do not include fever, fatigue, vomiting, diarrhea or hematochezia. The current episode started less than 1 hour ago. The onset of the illness was sudden.  The pain came on suddenly. The abdominal pain is generalized. The abdominal pain radiates to the RLQ. The abdominal pain is relieved by nothing. The abdominal pain is exacerbated by movement.   He has weakness, globally. He does not feel fatigued. He does not have any pain or numbness in his legs. He has no back pain. He routinely gets his abdomen scan for AAA. He has a stent in the abdominal aorta.  Past Medical History  Diagnosis Date  . Atrial fibrillation     paroxysmal  . Atrial flutter   . Aortic aneurysm   . Unspecified essential hypertension   . Anal fissure   . Melanoma   . Hyperlipidemia   . PVD (peripheral vascular disease)   . AAA (abdominal aortic aneurysm)   . SSS (sick sinus syndrome)     s/p PPM implantation  . Arthritis   . Mild claudication     Past Surgical History  Procedure Date  . Melanoma excision     4 times  . Cataract extraction   . Hernia repair   . Pacemaker insertion   . Tonsillectomy   . Abdominal aortic aneurysm repair   . Insert / replace / remove pacemaker   . Aortic stent graft   . US echocardiography 07/15/2008    EF 55-60%    Family History  Problem Relation  Age of Onset  . Breast cancer Daughter     granddaughter  . Diabetes Mother   . Liver disease Mother     History  Substance Use Topics  . Smoking status: Former Smoker    Types: Cigarettes    Quit date: 02/12/1971  . Smokeless tobacco: Never Used  . Alcohol Use: Yes     occasionally      Review of Systems  Constitutional: Negative for fever and fatigue.  Gastrointestinal: Positive for nausea and abdominal pain. Negative for vomiting, diarrhea and hematochezia.  All other systems reviewed and are negative.    Allergies  Review of patient's allergies indicates no known allergies.  Home Medications   Current Outpatient Rx  Name Route Sig Dispense Refill  . ALLOPURINOL 300 MG PO TABS Oral Take 300 mg by mouth daily.      Marland Kitchen CALCIUM CARBONATE PO Oral Take by mouth. Taking 1.5 Grams Daily    . DIGOXIN 0.125 MG PO TABS Oral Take 125 mcg by mouth daily.      . ERGOCALCIFEROL 50000 UNITS PO CAPS Oral Take 50,000 Units by mouth every 30 (thirty) days. Take on the first on every month.    Marland Kitchen FINASTERIDE 5 MG PO TABS Oral Take 5 mg by mouth daily.      . FOSINOPRIL SODIUM  40 MG PO TABS Oral Take 20 mg by mouth daily. 1/2 tablet once daily    . METOPROLOL TARTRATE 50 MG PO TABS Oral Take 25 mg by mouth 2 (two) times daily. 1/2 po bid    . SIMVASTATIN 20 MG PO TABS Oral Take 20 mg by mouth at bedtime.      . WARFARIN SODIUM 2.5 MG PO TABS Oral Take 2.5-5 mg by mouth daily. Take 2.5mg  on Tues and Thurs, and takes 5mg  on Sun, Mon, Wed, Fri, and Sat.      BP 204/105  Pulse 78  Temp(Src) 97.9 F (36.6 C) (Oral)  Resp 15  SpO2 98%  Physical Exam  Constitutional: He appears well-developed and well-nourished.  HENT:  Head: Normocephalic and atraumatic.  Eyes: Conjunctivae are normal. Pupils are equal, round, and reactive to light.  Neck: Normal range of motion. Neck supple.  Cardiovascular: Normal rate and regular rhythm.   Pulmonary/Chest: Effort normal and breath sounds normal.    Abdominal: Soft. Bowel sounds are normal.       Large pulsatile mass mid to lower abdomen, with right-sided component. The abdomen is diffusely tender in the lower aspect, primarily on the left. The abdomen is not distended. There is no evidence hemostasis of the abdominal wall. There is no pooling of blood in the flank. The cgroin exam is remarkable for postoperative hernia state on the right without palpable mass or swelling or tenderness in this area.  Skin: Skin is warm and dry.  Psychiatric: He has a normal mood and affect. His behavior is normal. Judgment and thought content normal.    ED Course  Procedures (including critical care time) I communicated with Dr. early, vascular surgeon, prior to and after the CT scan. The patient has acute rupture of abdominal aortic aneurysm. Dr. early does not want blood transfusion at this time. Initial hemoglobin is 16.7.  11:08 The patient remained hypertensive throughout the ED stay and required multiple doses of IV analgesia for pain.  CRITICAL CARE Performed by: Flint Melter   Total critical care time:45  Critical care time was exclusive of separately billable procedures and treating other patients.  Critical care was necessary to treat or prevent imminent or life-threatening deterioration.  Critical care was time spent personally by me on the following activities: development of treatment plan with patient and/or surrogate as well as nursing, discussions with consultants, evaluation of patient's response to treatment, examination of patient, obtaining history from patient or surrogate, ordering and performing treatments and interventions, ordering and review of laboratory studies, ordering and review of radiographic studies, pulse oximetry and re-evaluation of patient's condition. Labs Reviewed  CBC - Abnormal; Notable for the following:    WBC 10.7 (*)    Platelets 96 (*) CONSISTENT WITH PREVIOUS RESULT   All other components within  normal limits  DIFFERENTIAL - Abnormal; Notable for the following:    Neutrophils Relative 80 (*)    Neutro Abs 8.6 (*)    All other components within normal limits  PROTIME-INR - Abnormal; Notable for the following:    Prothrombin Time 20.8 (*)    INR 1.76 (*)    All other components within normal limits  COMPREHENSIVE METABOLIC PANEL - Abnormal; Notable for the following:    Glucose, Bld 145 (*)    BUN 26 (*)    GFR calc non Af Amer 56 (*)    GFR calc Af Amer 64 (*)    All other components within normal limits  POCT I-STAT,  CHEM 8 - Abnormal; Notable for the following:    BUN 29 (*)    Glucose, Bld 148 (*)    All other components within normal limits  PREPARE RBC (CROSSMATCH)  TYPE AND SCREEN  POCT I-STAT TROPONIN I  I-STAT, CHEM 8  I-STAT TROPONIN I  URINALYSIS, ROUTINE W REFLEX MICROSCOPIC  URINE CULTURE   Dg Chest 1 View  01/09/2011  *RADIOLOGY REPORT*  Clinical Data: Abdominal pain, ruptured AAA.  CHEST - 1 VIEW  Comparison: 09/08/2010.  Findings: Trachea is midline.  Heart size normal.  There may be minimal left basilar atelectasis or scarring.  Lungs are otherwise clear.  No pleural fluid.  IMPRESSION: Minimal left basilar atelectasis and/or scarring.  Original Report Authenticated By: Reyes Ivan, M.D.   Ct Angio Abd/pel W/ And/or W/o  01/09/2011  *RADIOLOGY REPORT*  Clinical Data:  AAA with stent repair.  Evaluate for leak.  New onset of abdominal pain.  CT ANGIOGRAPHY ABDOMEN AND PELVIS  Technique:  Multidetector CT imaging of the abdomen and pelvis was performed using the standard protocol during bolus administration of intravenous contrast.  Multiplanar reconstructed images including MIPs were obtained and reviewed to evaluate the vascular anatomy.  Contrast: 80mL OMNIPAQUE IOHEXOL 350 MG/ML IV SOLN  Comparison:  09/08/2009.  Findings:  Lung bases show mild scarring and/or atelectasis.  Heart size normal.  No pericardial or pleural effusion.  There are small pre  pericardiac and juxta diaphragmatic lymph nodes.  Liver is unremarkable.  Small stone layers in the gallbladder. Adrenal glands are unremarkable.  Low attenuation lesions in the kidneys measure up to 1.4 cm in the upper pole right kidney. Statistically, these are likely cysts.  Spleen, pancreas, stomach and bowel are unremarkable.  There is a 9.4 x 10.1 cm infrarenal aortic aneurysm with endovascular stent graft repair.  Contrast is seen within the aneurysmal sac, concentrated near the superior margin of the graft. High attenuation fluid is seen along the anterior and lateral margins of the aneurysm.  Left common iliac limb is occluded, as before, with reconstitution near the bifurcation of the left internal and external iliac arteries.  Bifemoral bypass graft opacifies.  Prostate is enlarged, measuring 7.3 cm.  No pathologically enlarged lymph nodes.  No worrisome lytic or sclerotic lesions.   Review of the MIP images confirms the above findings.  IMPRESSION:  1.  Endovascular stent graft repair of a large infrarenal aortic aneurysm with associated endoleak and rupture.  Endoleak is concentrated at the superior margin of the graft and may represent a type 1 or type 3 leak. Critical Value/emergent results were called by telephone at the time of interpretation on 01/09/2011  at 1050 hours  to  Dr. Effie Shy, who verbally acknowledged these results. 2.  Bifemoral bypass graft with occluded left iliac graft, as before. 3.  Cholelithiasis. 4.  Marked prostate enlargement.  Original Report Authenticated By: Reyes Ivan, M.D.    Date: 01/09/2011  Rate:   Rhythm: normal sinus rhythm  QRS Axis: normal  Intervals: normal  ST/T Wave abnormalities: normal  Conduction Disutrbances:none  Narrative Interpretation:   Old EKG Reviewed: changes noted   1. Abdominal aortic aneurysm rupture       MDM  Complicated ruptured abdominal aortic aneurysm. Patient is unstable and needs urgent vascular treatment in the  operative theater.        Flint Melter, MD 01/09/11 1205  Flint Melter, MD 01/09/11 [phone]

## 2011-01-09 NOTE — Transfer of Care (Signed)
Immediate Anesthesia Transfer of Care Note  Patient: [name] [name]  Procedure(s) Performed:  ENDOVASCULAR STENT GRAFT INSERTION - With Left  Brachial artery cut-down  Patient Location: PACU  Anesthesia Type: General  Level of Consciousness: awake, alert , oriented and patient cooperative  Airway & Oxygen Therapy: Patient Spontanous Breathing and Patient connected to nasal cannula oxygen  Post-op Assessment: Report given to PACU RN, Post -op Vital signs reviewed and stable and Patient moving all extremities  Post vital signs: Reviewed and stable  Complications: No apparent anesthesia complications

## 2011-01-09 NOTE — Op Note (Signed)
Procedure: #1 repair of ruptured abdominal aortic aneurysm using Renu Cook endovascular stent graft  #2 left brachial artery exposure and repair  Preoperative diagnosis: Ruptured abdominal aortic aneurysm  Postoperative diagnosis: Same  Anesthesia: Gen.  Co-surgeon: Leonides Sake M.D. Assistant: Newton Pigg PA-C  Indications: Patient is an 77 year old male who previously had endovascular stent graft repair approximately 10 years ago. He presented to the emergency room today with a ruptured abdominal aortic aneurysm. Review of the preoperative CT scan suggests that this is an Ancure graft. He looks like he may have a type I proximal endoleak. He also has a pre-existing femoral-femoral bypass and occlusion of his left iliac system. The femoral-femoral bypass is patent.  Operative findings: #1 proximal type I endoleak repaired with 36 x 116 Cook Renu Graft and 36 x 50 proximal Cook cuff    #2 left brachial artery exposure for contrast angiography dictated a separate operative note by Dr. Imogene Burn  Operative details: The patient was taken emergently from the emergency room to the preoperative holding area. Arterial and central venous catheters were placed by the anesthesia team. The patient was then taken to the operating room. After induction of general anesthesia a Foley catheter was placed. The patient's entire left arm was prepped and draped in usual sterile fashion. The patient was also prepped and draped in usual sterile fashion from the nipples to the knees. A left brachial artery exposure was performed near the antecubital crease of the left arm. The exposure and repair of this is dictated separately by Dr. Imogene Burn.  A longitudinal incision was made in the right groin over the femoral artery. Incision was carried down through the subcutaneous tissues down to the level of the common femoral artery. There is a femoral-femoral bypass which a pulse within it. This was at the lower portion of the  incision. I was able to get around the common femoral artery circumferentially just above the femoral-femoral bypass. Dissection was carried out on the common femoral artery up to the level of the circumflex iliac branches. Vessel loops were placed around both of these. Common femoral artery was dissected free underneath the inguinal ligament and vessel loop placed around this as well.  Next, wire access was obtained from a left brachial artery open approach by Dr. Imogene Burn. This is dictated separately in his operative note. Pigtail catheter was advanced over the guidewire after the catheter was advanced into the descending thoracic aorta. A contrast angiogram was obtained which showed the left and right renal arteries were patent and there was an obvious type I proximal endoleak. There did not appear to be any type II endoleak. The proximal end of the graft was a few centimeters below the left renal artery. A introducer needle was then used to cannulate the right common femoral artery and an 035 Bentson wire advanced through the right iliac system up into the abdominal aorta. A short 9 French sheath was then placed over the guidewire the right femoral system. However, due to tortuosity of the right iliac the sheath would not advance easily. So after a few centimeters of sheath was in the artery, I placed an 035 Amplatz wire through the sheath and I was able to push this up into the abdominal aorta and straighten the right iliac artery. The tip of the wire was placed in the descending thoracic aorta. At this point a Cook Renu 36 x 116 graft was brought up in the operative field. This was flushed in the usual fashion. The 9  French sheath was removed over the Amplatz wire and control was performed by pinching the artery. The 9 French sheath was removed and the Renu device placed over the Amplatz wire up into the abdominal aorta. Using angiography and roadmapping guidance, the precise location of the renal arteries were  located. A 15 craniocaudal and 10 RAO view seem to give [place] the best view of the proximal aorta. The first trigger wire was removed from the [facility] graft and the proximal portion of the graft then deployed right at the level of the left renal artery. Next the suprarenal stent was deployed. The remainder of the limb of the ring new graft was then deployed. The top cap was then recaptured and the dilator system removed from the sheath on the right side. A Coda balloon was then brought in the operative field and the proximal and distal landing zones of the graft were ballooned to profile. A completion arteriogram was then performed and there still seemed to be a small type I proximal endoleak near the level of the left renal artery. There were still some aorta uncovered just below this. I thought the best solution at this point would be to place a proximal cuff. A 36 x 50 mm Cook aortic proximal cuff was brought up in the operative field the sheath from the main body device was removed and the guidewire were placed with the proximal cuff introducer system. This was advanced up to the level of the renal arteries. With the stent now in place we were able to see that there was some parallax. The best view to make this result was a 20 craniocaudal view. With a C-arm in this position we obtained an additional contrast angiogram to determine precise level of the renal arteries. The proximal cuff was then deployed. The dilator was then removed and a coated balloon brought back up in the operative field and the entire proximal segment again reballooned. An additional completion arteriogram was then obtained and this showed no further proximal type I endoleak and no type II endoleak distally. Initially the right limb of the graft did not feel this is most likely due to occlusive flow of the sheath. Therefore a retrograde contrast angiogram was performed through the sheath and this showed rapid filling of the entire right iliac  limb with no evidence of distal type I or type II endoleak. At this point the pigtail catheter was removed over a guidewire from the left brachial access. The closure the brachial artery is dictated separately by Dr. Imogene Burn. The sheath and guidewires were then removed from the right groin. The artery was clamped proximally and distally with a peripheral DeBakeys clamp. The arteriotomy was repaired with interrupted 6-0 Prolene sutures. Just prior to completion anastomosis this was forebled backbled and thoroughly flushed. The anastomosis was secured the clamps were removed and there was pulsatile flow in the fem-fem bypass as well as the native common femoral artery immediately. There is good Doppler flow in the left common femoral artery as well as the pedal areas of both feet bilaterally. The groin was then closed in multiple layers with running 2 0 and 3 0 Vicryl suture and 4-0 Vicryl subcuticular stitch and the skin. Dermabond was applied. Closure left brachial artery is dictated separately by Dr. Imogene Burn. The patient tolerated procedure well and there were no complications. Patient was extubated in the operating room taken to recovery room in stable condition. He was hemodynamically stable throughout the course of the case.  Leonette Most  Trellis Vanoverbeke, MD [facility] [place] Office: [phone] Pager: [phone]

## 2011-01-09 NOTE — ED Notes (Signed)
MD at bedside.  Dr Early at the patient bedside discussing POC and surgical plans

## 2011-01-09 NOTE — Preoperative (Signed)
Beta Blockers   Reason not to administer Beta Blockers:Not Applicable 

## 2011-01-09 NOTE — ED Notes (Signed)
Plan for surgery ASAP, Dr Early in with consent form and patient signed, procedure explained and family at the bedside aware and agreeable to the plan

## 2011-01-09 NOTE — ED Notes (Signed)
Family at bedside. 

## 2011-01-09 NOTE — Progress Notes (Signed)
[facility] [place]  Post op check Pt still with abdominal pain.  Has actually been hypertensive.  BP better control now.  Abdomen with some tenderness  Objective 168/81 77 97.5 F (36.4 C) (Oral) 14 99%  Intake/Output Summary (Last 24 hours) at 01/09/11 1834 Last data filed at 01/09/11 1630  Gross per 24 hour  Intake   5200 ml  Output   2050 ml  Net   3150 ml    Assessment/Planning: S/p stent graft repair of ruptured AAA Will need to monitor abdominal pain ? Secondary to retroperitoneal hematoma  Will check post op hemoglobin and in a.m.  Or additionally if any hemodynamic instability.  No leak on completion angiogram so AAA should be sealed at this point   FIELDS,CHARLES E 01/09/2011 6:34 PM --  Laboratory Lab Results:  Basename 01/09/11 1726 01/09/11 1539 01/09/11 1015  WBC 8.3 -- 10.7*  HGB 12.3* 11.2* --  HCT 36.7* 33.0* --  PLT 76* -- 96*   BMET  Basename 01/09/11 1539 01/09/11 1337 01/09/11 1028 01/09/11 1015  NA 137 137 -- --  K 5.1 4.1 -- --  CL -- -- 105 101  CO2 -- -- -- 23  GLUCOSE -- -- 148* 145*  BUN -- -- 29* 26*  CREATININE -- -- 1.30 1.13  CALCIUM -- -- -- 9.3    COAG Lab Results  Component Value Date   INR 2.07* 01/09/2011   INR 1.76* 01/09/2011   INR 1.10 09/09/2010   No results found for this basename: PTT    Antibiotics Anti-infectives    None

## 2011-01-09 NOTE — Op Note (Addendum)
OPERATIVE NOTE   PROCEDURE:  1.  Left brachial artery exposure, cannulation and repair 2.  Aortogram 3.  Left arm angiogram 3.  Placement of aortic conversion graft (Aortounibody) (see Dr. Evelina Dun note) 4.  Placement of aortic proximal extension cuff (see Dr. Evelina Dun note)  PRE-OPERATIVE DIAGNOSIS: ruptured abdominal aortic aneurysm, Type I endoleak  POST-OPERATIVE DIAGNOSIS: same as above   CO-SURGEON: Leonides Sake, MD; Fabienne Bruns, MD  ANESTHESIA: general  ESTIMATED BLOOD LOSS: see Dr. Evelina Dun note  FINDING(S):  1.  Small type I endoleak after placement of Renu aortic conversion graft 2.  No further type I endoleak after placement of proximal aortic extension cuff 3.  Both renal arteries remain patent on completion aortogram 4.  Widely patent left radial arterial line tracing  SPECIMEN(S):  none  INDICATIONS:   [name] [name] is a 77 y.o. male who  presents with ruptured abdominal aortic aneurysm in the setting of prior endograft placement.  The patient's CTA of abdomen and pelvis demonstrates a type I endoleak proximally in the setting of an old endograft, possible Ancure.  Based on his geometry, both Dr. Darrick Penna and I felt he would be a good candidate for a [employer] conversion graft (aortounibody).  The patient is aware the risks of aortic surgery include but are not limited to: bleeding, need for transfusion, infection, death, stroke, paralysis, wound complications, impotence, bowel ischemia, and extended ventilation.  The patient agreed to proceed forward with the operation.  DESCRIPTION: The bulk of this operation will be dictated by Dr. Darrick Penna.  After full informed written consent was obtained from the patient, he was brought back to the operating room.  Prior to going to the operating room, he had central venous lines and arterial lines placed to facilitate his resuscitation and operation.  He also receive IV antibiotics and had a foley catheter placed for urinary  monitoring.  He was prepped and draped in the standard fashion for an open aortic or endograft repair.  I turned my attention to the left antecubitum.  I made an incision over the brachial artery and dissected down to the artery.  It was controlled with vessel loops proximally and distally.  I put a wet rad-tec over the artery then turned my attention to the right groin.  Dr. Darrick Penna had already started the right femoral artery exposure.  I assisted him with dissecting out the right femoral artery.  After we had a sufficient segment of common femoral artery dissected out, we turned our attention back to the antecubitum.  I cannulated the left brachial artery with a 18 gauge needle.  I then passed a Benson wire through the artery.  I met with significant resistance and there appeared to be some buckling in the wire, so I elected to pull the wire and needle and hold pressure.  I eventually repaired the puncture with a figure of eight stitch of 6-0 prolene.  I then cannulated the left brachial artery with a micropuncture needle and loaded a microwire through the needle.  The microsheath was loaded over the wire into the brachial artery.  I then did a hand injection which demonstrated a patent tortuous brachial artery with some spasm in it.  I then placed a Glidewire through the sheath and advanced into the aorta.  I loaded a Kumpfe catheter over the wire and was able to get down into the descending aorta with some difficulty due to the multiple wires already present in this patient's chest.  The wire was exchanged  for a Wholey wire and then the catheter exchanged for a pigtail catheter which I placed at the level of the suprarenal aorta.  The next portion of the case can be found in Dr. Evelina Dun note.  After successfully completing the proximal aortic cuff extension, I turned my attention to helping Dr. Darrick Penna repair the right common femoral artery, details can be found in his note.  I then turned my attention to the  right brachial artery.  I clamped the artery proximally and distally and then placed a figure of eight stitch with 6-0 prolene to repair the arterial puncture.  There was no further bleeding.  I washed out the antecubital wound and then repaired it with a layer of 3-0 Vicryl and then a 4-0 Monocryl in a subcuticular fashion.  The skin incision was reinforced with Dermabond.  There was strong left radial arterial tracing at the end of this portion of the case.  COMPLICATIONS: none  CONDITION: stable  Leonides Sake, MD [facility] Office: [phone] Pager: [phone]  01/09/2011, 4:06 PM

## 2011-01-10 ENCOUNTER — Encounter (HOSPITAL_COMMUNITY): Payer: Self-pay | Admitting: Physician Assistant

## 2011-01-10 DIAGNOSIS — I4891 Unspecified atrial fibrillation: Secondary | ICD-10-CM

## 2011-01-10 DIAGNOSIS — I10 Essential (primary) hypertension: Secondary | ICD-10-CM

## 2011-01-10 DIAGNOSIS — I713 Abdominal aortic aneurysm, ruptured: Secondary | ICD-10-CM

## 2011-01-10 LAB — BASIC METABOLIC PANEL
CO2: 23 mEq/L (ref 19–32)
Chloride: 103 mEq/L (ref 96–112)
Creatinine, Ser: 1.03 mg/dL (ref 0.50–1.35)
Potassium: 4.3 mEq/L (ref 3.5–5.1)

## 2011-01-10 LAB — CBC
HCT: 37.2 % — ABNORMAL LOW (ref 39.0–52.0)
MCV: 91.2 fL (ref 78.0–100.0)
Platelets: 103 10*3/uL — ABNORMAL LOW (ref 150–400)
RBC: 4.08 MIL/uL — ABNORMAL LOW (ref 4.22–5.81)
WBC: 8.6 10*3/uL (ref 4.0–10.5)

## 2011-01-10 LAB — URINE CULTURE
Culture  Setup Time: 201211281150
Culture: NO GROWTH

## 2011-01-10 MED ORDER — DIGOXIN 0.25 MG/ML IJ SOLN
0.5000 mg | Freq: Once | INTRAMUSCULAR | Status: AC
  Administered 2011-01-10: 0.5 mg via INTRAVENOUS
  Filled 2011-01-10: qty 2

## 2011-01-10 MED ORDER — DILTIAZEM HCL 100 MG IV SOLR
5.0000 mg/h | INTRAVENOUS | Status: DC
  Administered 2011-01-10: 5 mg/h via INTRAVENOUS
  Filled 2011-01-10: qty 100

## 2011-01-10 MED ORDER — BIOTENE DRY MOUTH MT LIQD
15.0000 mL | Freq: Three times a day (TID) | OROMUCOSAL | Status: DC
  Administered 2011-01-10 – 2011-01-12 (×6): 15 mL via OROMUCOSAL

## 2011-01-10 MED ORDER — DIGOXIN 125 MCG PO TABS
125.0000 ug | ORAL_TABLET | Freq: Every day | ORAL | Status: DC
  Administered 2011-01-11 – 2011-01-15 (×5): 125 ug via ORAL
  Filled 2011-01-10 (×5): qty 1

## 2011-01-10 MED ORDER — DOPAMINE-DEXTROSE 3.2-5 MG/ML-% IV SOLN: 3.0000 ug/kg/min | INTRAVENOUS | Status: DC

## 2011-01-10 MED ORDER — DEXTROSE 5 % IV SOLN
5.0000 mg/h | INTRAVENOUS | Status: DC
  Administered 2011-01-10: 5 mg/h via INTRAVENOUS
  Administered 2011-01-10 – 2011-01-12 (×3): 15 mg/h via INTRAVENOUS
  Filled 2011-01-10 (×5): qty 100

## 2011-01-10 MED ORDER — METOPROLOL TARTRATE 25 MG PO TABS
25.0000 mg | ORAL_TABLET | Freq: Two times a day (BID) | ORAL | Status: DC
  Administered 2011-01-11 – 2011-01-12 (×3): 25 mg via ORAL
  Filled 2011-01-10 (×4): qty 1

## 2011-01-10 MED ORDER — SODIUM CHLORIDE 0.9 % IJ SOLN
3.0000 mL | Freq: Two times a day (BID) | INTRAMUSCULAR | Status: DC
  Administered 2011-01-10 – 2011-01-15 (×13): 3 mL via INTRAVENOUS

## 2011-01-10 MED ORDER — SODIUM CHLORIDE 0.9 % IJ SOLN
10.0000 mL | Freq: Two times a day (BID) | INTRAMUSCULAR | Status: DC
  Administered 2011-01-10 – 2011-01-12 (×4): 10 mL via INTRAVENOUS
  Filled 2011-01-10: qty 10
  Filled 2011-01-10: qty 20

## 2011-01-10 NOTE — Progress Notes (Signed)
eLink Physician-Brief Progress Note Patient Name: [name] [name] DOB: [birth year] MRN: [id number]  Date of Service  01/10/2011   HPI/Events of Note   AF with RVR HR in the 140s to 150s not responding to lopressor IV  eICU Interventions  HD stable with BP of 142/63 MAP of 80s.  Will start cardizem gtt with 10 mg bolus then titrate gtt to achieve HR control   Intervention Category Intermediate Interventions: Arrhythmia - evaluation and management  DETERDING,ELIZABETH 01/10/2011, 3:43 AM

## 2011-01-10 NOTE — Consult Note (Signed)
CARDIOLOGY CONSULT NOTE  Patient ID: [name] [name], MRN: [id number], DOB/AGE: 09/02/1919 77 y.o. Admit date: 01/09/2011 Date of Consult: 01/10/2011  Primary Physician: Garlan Fillers, MD, MD Primary Cardiologist: Dr. Swaziland  / Electrophysiologist: Dr. Johney Frame  Chief Complaint: abdominal pain Reason for Consultation: atrial fibrillation in a patient with a history of atrial arrythmias who is POD#1 s/p ruptured AAA repair  HPI: 77 y/o M with hx of PAF/flutter, HTN, PVD with known prior AAA repair and fem-fem bpg presented to [facility] with complaints of sudden onset RLQ pain worse with movement and weakness. He was markedly hypertensive 204/105 on presentation with a large pulsatile mass in his lower abdomen. CT demonstrated ruptured AAA, and Vascular was consulted. He was taken emergently to the OR yesterday and ultimately had repair using an endovascular stent graft. The patient developed atrial arrhythmias overnight with HR up into the 150's -- see telemetry. Some of this appears to be sinus tach with PACs, some questionable atrial fibrillation, and some atrial tachycardia with varying P morphologies. Overnight nursing states he received IV Lopressor x2, IV hydralazine x2 (for BP), and IV cardizem, but ultimately dropped his pressure into the 70's. Cardizem was therefore stopped per nursing (he also has an A-line in with pressures recorded no less than 103/34) . His a-line seems to vary a bit from his manual cuff. [facility] has seen the patient today and are giving 0.5mg  IV digoxin today (level was <0.3) then restarting his home BB and digoxin tomorrow. He is currently running about 75bpm NSR. He has already been up and walking with nursing. He feels well today without any CP, SOB, or dizziness. He still has some residual abdominal pain but it is much better than before.  Of note, INR was 2.07 yesterday and is 2.23 today.  Past Medical History  Diagnosis Date  . Atrial fibrillation    paroxysmal  . Atrial flutter   . Unspecified essential hypertension   . Anal fissure   . Melanoma   . Hyperlipidemia   . PVD (peripheral vascular disease)     s/p fem-fem bypass grafting 2006  . AAA (abdominal aortic aneurysm)     s/p graft repair 2000  . SSS (sick sinus syndrome)     s/p Medtronic PPM implantation 07/2008  . Arthritis   . Mild claudication       Surgical History:  Past Surgical History  Procedure Date  . Melanoma excision     4 times  . Cataract extraction   . Hernia repair   . Pacemaker insertion   . Tonsillectomy   . Abdominal aortic aneurysm repair   . Aortic stent graft   . US echocardiography 07/15/2008    EF 55-60%     Home Meds: Medication Sig  allopurinol (ZYLOPRIM) 300 MG tablet Take 300 mg by mouth daily.    CALCIUM CARBONATE PO Take by mouth. Taking 1.5 Grams Daily  digoxin (LANOXIN) 0.125 MG tablet Take 125 mcg by mouth daily.    ergocalciferol (VITAMIN D2) 50000 UNITS capsule Take 50,000 Units by mouth every 30 (thirty) days. Take on the first on every month.  finasteride (PROSCAR) 5 MG tablet Take 5 mg by mouth daily.    fosinopril (MONOPRIL) 40 MG tablet Take 20 mg by mouth daily. 1/2 tablet once daily  metoprolol (LOPRESSOR) 50 MG tablet Take 25 mg by mouth 2 (two) times daily. 1/2 po bid  simvastatin (ZOCOR) 20 MG tablet Take 20 mg by mouth at bedtime.  warfarin (COUMADIN) 2.5 MG tablet Take 2.5-5 mg by mouth daily. Take 2.5mg  on Tues and Thurs, and takes 5mg  on Sun, Mon, Wed, Fri, and Sat.   Inpatient Medications:    . sodium chloride   Intravenous Once  . cefUROXime (ZINACEF)  IV  1.5 g Intravenous Q12H  . digoxin  0.5 mg Intravenous Once  . digoxin  125 mcg Oral Daily  . droperidol  0.625 mg Intravenous Once  . famotidine (PEPCID) IV  20 mg Intravenous Q12H  . metoprolol  25 mg Oral BID  .  morphine injection  2 mg Intravenous Once  .  morphine injection  4 mg Intravenous Once  . morphine      . ondansetron (ZOFRAN) IV  4 mg  Intravenous Once  . sodium chloride  500 mL Intravenous Once  . sodium chloride  10 mL Intravenous Q12H  . sodium chloride  3 mL Intravenous Q12H  . DISCONTD:  morphine injection  4 mg Intravenous Once    Allergies: No Known Allergies  History   Social History  . Marital Status: Married    Spouse Name: N/A    Number of Children: 3  . Years of Education: N/A   Occupational History  . retired Hotel manager    Social History Main Topics  . Smoking status: Former Smoker    Types: Cigarettes    Quit date: 02/12/1971  . Smokeless tobacco: Never Used  . Alcohol Use: Yes     occasionally  . Drug Use: No  . Sexually Active: Not on file   Other Topics Concern  . Not on file   Social History Narrative  . No narrative on file     Family History  Problem Relation Age of Onset  . Breast cancer Daughter     granddaughter  . Diabetes Mother   . Liver disease Mother      Review of Systems: General: negative for chills, fever, night sweats or weight changes.  Cardiovascular: negative for chest pain, shortness of breath dyspnea on exertion, edema, orthopnea, palpitations, or paroxysmal nocturnal dyspnea Dermatological: negative for rash Respiratory: negative for cough or wheezing Urologic: negative for hematuria Abdominal: Positive for abdominal pain. No current nausea, vomiting, diarrhea, bright red blood per rectum, melena, or hematemesis Neurologic: negative for visual changes, syncope, or dizziness All other systems reviewed and are otherwise negative except as noted above.  Labs: No results found for this basename: CKTOTAL:4,CKMB:4,TROPONINI:4 in the last 72 hours Lab Results  Component Value Date   WBC 8.6 01/10/2011   HGB 12.2* 01/10/2011   HCT 37.2* 01/10/2011   MCV 91.2 01/10/2011   PLT 103* 01/10/2011    Lab 01/10/11 0419 01/09/11 2020  NA 135 --  K 4.3 --  CL 103 --  CO2 23 --  BUN 17 --  CREATININE 1.03 --  CALCIUM 8.1* --  PROT -- 5.3*  BILITOT -- 0.8    ALKPHOS -- 42  ALT -- 8  AST -- 14  GLUCOSE 168* --    Radiology/Studies:  1. 01/09/2011   CHEST - 1 VIEW  Comparison: CT abdomen and pelvis earlier today.  Findings: Multiple intraoperative images were performed for planning and positioning of abdominal stent graft.  2. 01/09/11 ABDOMEN - 1 VIEW  IMPRESSION: Abdominal aortic aneurysm stent graft revision without demonstrated complication.   3. 01/09/2011 CT ANGIOGRAPHY ABDOMEN AND PELVIS    IMPRESSION:  1.  Endovascular stent graft repair of a large infrarenal aortic aneurysm with associated endoleak and  rupture.  Endoleak is concentrated at the superior margin of the graft and may represent a type 1 or type 3 leak. Critical Value/emergent results were called by telephone at the time of interpretation on 01/09/2011  at 1050 hours  to  Dr. Effie Shy, who verbally acknowledged these results. 2.  Bifemoral bypass graft with occluded left iliac graft, as before. 3.  Cholelithiasis. 4.  Marked prostate enlargement.   EKG: Appears to be NSR with frequent PACs 68bpm without acute changes.  Physical Exam: Blood pressure 105/44, pulse 70, temperature 97.4 F (36.3 C), temperature source Oral, resp. rate 20, height 5\' 9"  (1.753 m), weight 175 lb 4.3 oz (79.5 kg), SpO2 97.00%. General: Well developed comfortable appearing elderly M in no acute distress. Head: Normocephalic, atraumatic, sclera non-icteric, no xanthomas, nares are without discharge.  Neck: No obvious lymphadenopathy. JVD not elevated. Lungs: Clear bilaterally to auscultation without wheezes, rales, or rhonchi. Breathing is unlabored. Heart: RRR with S1 S2 - occasional ectopy. No significant murmurs, rubs, or gallops appreciated. Abdomen: Soft, non-distended with hypoactive bowel sounds. Not obviously tender. No hepatomegaly. No rebound/guarding. Msk:  Strength and tone appear normal for age. Extremities: No clubbing or cyanosis. No edema.  Distal pedal pulses are 1+ and equal  bilaterally. Neuro: Alert and oriented X 3. Moves all extremities spontaneously. Psych:  Responds to questions appropriately with a normal affect.   Assessment and Plan:   1. Ruptured AAA POD#1 - feet warm this morning. Recovering nicely, already ambulating. Management per surgery.  2. Atrial arrhythmias with history of atrial fibrillation and atrial flutter, currently maintaining NSR with occasional PACs. - Coumadin on hold for now, would resume when surgery feels it is okay - Written to start po digoxin and metoprolol tomorrow - History of Medtronic PPM - no indications for interrogation at present  3. Hypotension - discrepancy between cuff/A-line. Asymptomatic. Per A-line, no pressures lower than 103/34. Would reintroduce home meds only as pressure allows.  4. Hyperglycemia - may be related to acute illness, but may also need f/u at d/c for possible DM.  Signed, Dayna Dunn PA-C 01/10/2011, 10:10 AM  History reviewed with the patient, no changes to be made.  He is pleasant and denies any chest pain or SOB.  He was transiently in atrial fib but is now in NSR The patient exam reveals lungs clear,  RRR without murmur or rub.  All available labs, radiology testing, previous records reviewed. Agree with documented assessment and plan.  OK to start meds as planned in the am when taking PO.  We will follow rhythm and treat if he has recurrent atrial fibrillation.   Terrace Fontanilla  10:30 AM 12/28/2010

## 2011-01-10 NOTE — Progress Notes (Signed)
Patient ID: [name] [name], male   DOB: 02/07/1920, 77 y.o.   MRN: [id number] [facility]  Subjective  - POD #1 from Stent graft repair of ruptured AAA.  Still complains of abdominal pain but overall appears fairly comfortable.  Had some problems with Afib rate control overnight.  Objective 116/68 105 currently on Diltiazem drip with heart rate 80s 98.5 F (36.9 C) (Oral) 19 98%  Intake/Output Summary (Last 24 hours) at 01/10/11 0445 Last data filed at 01/10/11 0300  Gross per 24 hour  Intake 5607.5 ml  Output   3290 ml  Net 2317.5 ml  Adequate urine output approx 40 ml/hr Left hand pink warm, no arm hematoma Abdomen slightly distended overall non tender with palpation and distraction Right groin no hematoma Feet warm bilaterally  Assessment/Planning: POD #1 S/p AAA repair still with abdominal pain but appears comfortable and no hypotension.  This is most likely from retroperitoneal hematoma but will definitely need repeat CT in the next few days prior to discharge Will check lab work later this am to assess hemoglobin.  Atrial fibrillation chronic-will check digoxin level this am and discuss with cardiology for rate control assistance followed by Dr. Swaziland  On chronic coumadin continue to hold for now  Out of bed ambulate  Keep NPO while having abdominal pain  D/c left a-line  D/c Foley  FIELDS,CHARLES E 01/10/2011 4:45 AM --  Laboratory Lab Results:  Basename 01/09/11 1726 01/09/11 1539 01/09/11 1015  WBC 8.3 -- 10.7*  HGB 12.3* 11.2* --  HCT 36.7* 33.0* --  PLT 76* -- 96*   BMET  Basename 01/09/11 2020 01/09/11 1539 01/09/11 1028 01/09/11 1015  NA 135 137 -- --  K 4.2 5.1 -- --  CL 104 -- 105 --  CO2 23 -- -- 23  GLUCOSE 165* -- 148* --  BUN 18 -- 29* --  CREATININE 0.90 -- 1.30 --  CALCIUM 8.2* -- -- 9.3    COAG Lab Results  Component Value Date   INR 2.07* 01/09/2011   INR 1.76* 01/09/2011   INR 1.10  09/09/2010   No results found for this basename: PTT    Antibiotics Anti-infectives     Start     Dose/Rate Route Frequency Ordered Stop   01/09/11 2200   cefUROXime (ZINACEF) 1.5 g in dextrose 5 % 50 mL IVPB        1.5 g 100 mL/hr over 30 Minutes Intravenous Every 12 hours 01/09/11 1958 01/10/11 2159

## 2011-01-10 NOTE — Consult Note (Signed)
Name: [name] [name] MRN: [id number] DOB: 1920-01-12   LOS: 1  CRITICAL CARE ADMISSION NOTE  History of Present Illness: 77 y/o male with a history of multipe AAA stent/graft repairs who presented on 01/09/11 with ruptured AAA by CT.  Underwent Placement of aortic conversion graft and proximal aortic proximal extension cuff by Drs. Field and Chen on 01/09/11, PCCM consulted for post op management.   Yesterday morning he noted the abrupt onset of abdominal pain and a gushing feeling while having a bowel movement.  He then called 911 and was brought to the ED for further management.    He feels well this morning and has already walked with PT.  He had an episode of a-fib with RVR last night.  Lines / Drains: 11/28 R IJ CVL 11/28 L radial A line  Cultures / Sepsis markers:   Antibiotics: Cefuroxime proph  Tests / Events: 11/28 Endovascular repair of AAA graft/rupture   Past Medical History  Diagnosis Date  . Atrial fibrillation     paroxysmal  . Atrial flutter   . Unspecified essential hypertension   . Anal fissure   . Melanoma   . Hyperlipidemia   . PVD (peripheral vascular disease)     s/p fem-fem bypass grafting 2006  . AAA (abdominal aortic aneurysm)     s/p graft repair 2000  . SSS (sick sinus syndrome)     s/p Medtronic PPM implantation 07/2008  . Arthritis   . Mild claudication    Past Surgical History  Procedure Date  . Melanoma excision     4 times  . Cataract extraction   . Hernia repair   . Pacemaker insertion   . Tonsillectomy   . Abdominal aortic aneurysm repair   . Aortic stent graft   . US echocardiography 07/15/2008    EF 55-60%   Prior to Admission medications   Medication Sig Start Date End Date Taking? Authorizing Provider  allopurinol (ZYLOPRIM) 300 MG tablet Take 300 mg by mouth daily.     Yes Historical Provider, MD  CALCIUM CARBONATE PO Take by mouth. Taking 1.5 Grams Daily   Yes Historical Provider, MD  digoxin (LANOXIN) 0.125 MG tablet  Take 125 mcg by mouth daily.     Yes Historical Provider, MD  ergocalciferol (VITAMIN D2) 50000 UNITS capsule Take 50,000 Units by mouth every 30 (thirty) days. Take on the first on every month.   Yes Historical Provider, MD  finasteride (PROSCAR) 5 MG tablet Take 5 mg by mouth daily.     Yes Historical Provider, MD  fosinopril (MONOPRIL) 40 MG tablet Take 20 mg by mouth daily. 1/2 tablet once daily   Yes Historical Provider, MD  metoprolol (LOPRESSOR) 50 MG tablet Take 25 mg by mouth 2 (two) times daily. 1/2 po bid   Yes Historical Provider, MD  simvastatin (ZOCOR) 20 MG tablet Take 20 mg by mouth at bedtime.     Yes Historical Provider, MD  warfarin (COUMADIN) 2.5 MG tablet Take 2.5-5 mg by mouth daily. Take 2.5mg  on Tues and Thurs, and takes 5mg  on Sun, Mon, Wed, Fri, and Sat.   Yes Historical Provider, MD   No Known Allergies Family History  Problem Relation Age of Onset  . Breast cancer Daughter     granddaughter  . Diabetes Mother   . Liver disease Mother    Social History  reports that he quit smoking about 39 years ago. His smoking use included Cigarettes. He has never used smokeless tobacco. He reports  that he drinks alcohol. He reports that he does not use illicit drugs.  Review Of Systems    Gen: Denies fever, chills, weight change, fatigue, night sweats HEENT: Denies blurred vision, double vision, hearing loss, tinnitus, sinus congestion, rhinorrhea, sore throat, neck stiffness, dysphagia PULM: Denies shortness of breath, cough, sputum production, hemoptysis, wheezing CV: Denies chest pain, edema, orthopnea, paroxysmal nocturnal dyspnea, palpitations GI: Notes very mild abdominal pain (much improved), denies nausea, vomiting, diarrhea, hematochezia, melena, constipation, change in bowel habits GU: Denies dysuria, hematuria, polyuria, oliguria, urethral discharge Endocrine: Denies hot or cold intolerance, polyuria, polyphagia or appetite change Derm: Denies rash, dry skin,  scaling or peeling skin change Heme: Denies easy bruising, bleeding, bleeding gums Neuro: Denies headache, numbness, weakness, slurred speech, loss of memory or conciousness   Vital Signs:   Filed Vitals:   01/10/11 0600 01/10/11 0700 01/10/11 0715 01/10/11 0828  BP: 88/44 86/43 105/44   Pulse: 70 70 70   Temp:    97.4 F (36.3 C)  TempSrc:    Oral  Resp: 19 25 20    Height:      Weight:      SpO2: 96% 96% 97%    Physical Examination:  Gen: no acute distress HEENT: NCAT, PERRL, EOMi, OP with some oropharynx bruising on soft pallate no edema,  Neck: supple without masses, R IJ CVL in  PULM: CTA B CV: RRR few PVCs, no mgr, no JVD AB: BS+, soft, nontender, no hsm; R inguinal/groin scar clean, dry intact without significant swelling Ext: warm, no edema, no clubbing, no cyanosis 2+ dp/pt pulse R foot Derm: scattered bruises on extensor surfaces of arms Neuro: A&Ox4, CN II-XII intact, strength 5/5 in all 4 extremeties   Labs and Imaging:  Reviewed, see A&P below.  Assessment and Plan:  77 y/o male with a-fib and multiple AAA repairs presented 11/28 for management of acute AAA rupture.  Remained hemodynamically stable throughout and did well with endovascular repair.  Now recovering in 2300  AAA rupture: s/p endovascular repair.  Feet warm with good pulses this morning.   Plan:  -per vascular surgery -pull aline and foley today -PT following  Deconditioning Plan: -PT consult/rehab  A-fib: episode of RVR overnight, on dig at home Plan: -cardiology consult -dig/metop for now -hold warfarin  SSS: s/p medtronic pacer placed Plan: -cardiology consult  Hyperlipidemia: Plan: -restart zocor when taking po   Best practices / Disposition: Feeding/protein calorie malnutrition: npo while still having abdominal pain Analgesia: prn acetaminophen Sedation: n/a Thromboprophylaxis: scd HOB >30 degrees Ulcer prophylaxis: pepcid bid Glucose control: follow cbg  Heber Crossnore, M.D. Pulmonary and Critical Care Medicine [facility] Pager: [phone]  01/10/2011, 9:15 AM

## 2011-01-11 ENCOUNTER — Encounter (HOSPITAL_COMMUNITY): Payer: Self-pay | Admitting: Vascular Surgery

## 2011-01-11 LAB — CBC
MCH: 30.9 pg (ref 26.0–34.0)
MCHC: 33.2 g/dL (ref 30.0–36.0)
MCV: 93.1 fL (ref 78.0–100.0)
Platelets: 78 10*3/uL — ABNORMAL LOW (ref 150–400)
RDW: 16 % — ABNORMAL HIGH (ref 11.5–15.5)
WBC: 7.2 10*3/uL (ref 4.0–10.5)

## 2011-01-11 LAB — BASIC METABOLIC PANEL
BUN: 18 mg/dL (ref 6–23)
CO2: 24 mEq/L (ref 19–32)
Calcium: 8.1 mg/dL — ABNORMAL LOW (ref 8.4–10.5)
Creatinine, Ser: 1.22 mg/dL (ref 0.50–1.35)
GFR calc non Af Amer: 51 mL/min — ABNORMAL LOW (ref >90)
Glucose, Bld: 133 mg/dL — ABNORMAL HIGH (ref 70–99)

## 2011-01-11 LAB — HEMOGLOBIN AND HEMATOCRIT, BLOOD: HCT: 32.7 % — ABNORMAL LOW (ref 39.0–52.0)

## 2011-01-11 MED ORDER — FAMOTIDINE 20 MG PO TABS
20.0000 mg | ORAL_TABLET | Freq: Two times a day (BID) | ORAL | Status: DC
  Administered 2011-01-11 – 2011-01-15 (×8): 20 mg via ORAL
  Filled 2011-01-11 (×9): qty 1

## 2011-01-11 NOTE — Progress Notes (Signed)
SUBJECTIVE: The patient is doing well today.  Has had afib with RVR but otherwise appears to be recovering from surgery quite well.  At this time, he denies chest pain, shortness of breath, or any new concerns.     Marland Kitchen antiseptic oral rinse  15 mL Mouth Rinse TID WC & HS  . cefUROXime (ZINACEF)  IV  1.5 g Intravenous Q12H  . digoxin  0.5 mg Intravenous Once  . digoxin  125 mcg Oral Daily  . famotidine (PEPCID) IV  20 mg Intravenous Q12H  . metoprolol  25 mg Oral BID  . sodium chloride  10 mL Intravenous Q12H  . sodium chloride  3 mL Intravenous Q12H      . dextrose 5 % and 0.45 % NaCl with KCl 20 mEq/L 75 mL/hr at 01/11/11 0800  . diltiazem (CARDIZEM) infusion 15 mg/hr (01/11/11 0800)  . DOPamine      OBJECTIVE: Physical Exam: Filed Vitals:   01/11/11 0600 01/11/11 0700 01/11/11 0733 01/11/11 0800  BP: 136/69 114/68  110/67  Pulse: 102 95  100  Temp:   98.2 F (36.8 C)   TempSrc:   Oral   Resp: 14 17  22   Height:      Weight: 176 lb 2.4 oz (79.9 kg)     SpO2: 96% 96%  98%    Intake/Output Summary (Last 24 hours) at 01/11/11 0848 Last data filed at 01/11/11 0800  Gross per 24 hour  Intake 2172.5 ml  Output   1250 ml  Net  922.5 ml    Telemetry reveals intermittent afib with RVR, now afib V rate 80s  GEN- The patient is well appearing, alert and oriented x 3 today.   Head- normocephalic, atraumatic Eyes-  Sclera clear, conjunctiva pink Ears- hearing intact Oropharynx- clear Neck- supple, no JVP Lymph- no cervical lymphadenopathy Lungs- Clear to ausculation bilaterally, normal work of breathing Heart- irregular rate and rhythm,  GI- soft, appropriately tender Extremities- no clubbing, cyanosis, dependant edema Skin- no rash or lesion Psych- euthymic mood, full affect Neuro- strength and sensation are intact  LABS: Basic Metabolic Panel:  Basename 01/11/11 0345 01/10/11 0419 01/09/11 1726  NA 137 135 --  K 4.2 4.3 --  CL 106 103 --  CO2 24 23 --  GLUCOSE  133* 168* --  BUN 18 17 --  CREATININE 1.22 1.03 --  CALCIUM 8.1* 8.1* --  MG -- -- 1.5  PHOS -- -- --   Liver Function Tests:  [facility] 01/09/11 2020 01/09/11 1015  AST 14 17  ALT 8 12  ALKPHOS 42 54  BILITOT 0.8 1.0  PROT 5.3* 7.2  ALBUMIN 3.0* 4.0   No results found for this basename: LIPASE:2,AMYLASE:2 in the last 72 hours CBC:  Basename 01/11/11 0345 01/10/11 0419 01/09/11 1015  WBC 7.2 8.6 --  NEUTROABS -- -- 8.6*  HGB 10.3* 12.2* --  HCT 31.0* 37.2* --  MCV 93.1 91.2 --  PLT 78* 103* --    ASSESSMENT AND PLAN:   Postoperative afib with intermittent RVR Pressure was soft yesterday but appears better now. I expect that as he recovers from surgery, his afib should improve. Continue diltiazem gtt until more stable and then convert to PO diltiazem at that time. If afib persists more than several days, may need to consider amiodarone for a short post operative course. Therpeutic INR,  Continue coumadin if OK with primary team.   Hillis Range, MD 01/11/2011 8:48 AM

## 2011-01-11 NOTE — Progress Notes (Signed)
Utilization review completed. Anette Guarneri, RN, BSN. 01/11/11

## 2011-01-11 NOTE — Progress Notes (Addendum)
Patient ID: [name] [name], male   DOB: 1919-10-07, 77 y.o.   MRN: [id number] [facility]  Subjective  - POD #2 Overall feels better.  Abdominal pain resolving.  Tolerating clear liquids.  Objective 110/67 81 98.2 F (36.8 C) (Oral) 23 97%  Intake/Output Summary (Last 24 hours) at 01/11/11 1033 Last data filed at 01/11/11 0943  Gross per 24 hour  Intake 2047.5 ml  Output   1300 ml  Net  747.5 ml   Groin and arm incisions healing Abdomen softer completely non tender  Assessment/Planning: POD #2 Repair ruptured AAA  Acute blood loss anemia continue to follow Rapid Afib better controlled appreciate cardiology input Transfer to stepdown.  Keep central line in until off IV diltiazem especially if needs further titrating Will need CT angio to establish new baseline prior to being discharged to home Coagulopathy INR > 2 despite no coumadin for several days.  Continue to hold until hemoglobin stable and after CT scan. Advance diet. Ambulate.  [name] [name] E 01/11/2011 10:33 AM --  Laboratory Lab Results:  Basename 01/11/11 0345 01/10/11 0419  WBC 7.2 8.6  HGB 10.3* 12.2*  HCT 31.0* 37.2*  PLT 78* 103*   BMET  Basename 01/11/11 0345 01/10/11 0419  NA 137 135  K 4.2 4.3  CL 106 103  CO2 24 23  GLUCOSE 133* 168*  BUN 18 17  CREATININE 1.22 1.03  CALCIUM 8.1* 8.1*    COAG Lab Results  Component Value Date   INR 2.23* 01/11/2011   INR 2.23* 01/10/2011   INR 2.07* 01/09/2011   No results found for this basename: PTT    Antibiotics Anti-infectives     Start     Dose/Rate Route Frequency Ordered Stop   01/09/11 2200   cefUROXime (ZINACEF) 1.5 g in dextrose 5 % 50 mL IVPB        1.5 g 100 mL/hr over 30 Minutes Intravenous Every 12 hours 01/09/11 1958 01/10/11 1057

## 2011-01-11 NOTE — Progress Notes (Signed)
Name: [name] [name] MRN: [id number] DOB: 06-14-1919   LOS: 2  CRITICAL CARE ADMISSION NOTE  History of Present Illness: 77 y/o male with a history of multiple AAA stent/graft repairs who presented on 01/09/11 with ruptured AAA by CT.  Underwent Placement of aortic conversion graft and proximal aortic proximal extension cuff by Drs. Field and Chen on 01/09/11, PCCM consulted for post op management.   Yesterday morning he noted the abrupt onset of abdominal pain and a gushing feeling while having a bowel movement.  He then called 911 and was brought to the ED for further management.    Lines / Drains: 11/28 R IJ CVL 11/28 L radial A line  Cultures / Sepsis markers:   Antibiotics: Cefuroxime proph off  Tests / Events: 11/28 Endovascular repair of AAA graft/rupture  Overnight: Feels well this morning; slept well.  Has had SVT multiple times throughout the night, on cardizem drip. Complains of pain at incision sight.  Vital Signs:   Filed Vitals:   01/11/11 0200 01/11/11 0300 01/11/11 0400 01/11/11 0409  BP: 129/78 126/49 125/48   Pulse: 79 111 70   Temp:    97.7 F (36.5 C)  TempSrc:    Oral  Resp: 16 19 18    Height:      Weight:      SpO2: 94% 94% 94%    Physical Examination:  Gen: no acute distress HEENT: NCAT, PERRL, EOMi,   Neck: supple without masses, R IJ CVL in  PULM: CTA B CV: RRR few PVCs, no mgr, no JVD AB: BS+, soft, nontender, no hsm; R inguinal/groin scar clean, dry intact without significant swelling (no change this AM) Ext: warm, no edema, no clubbing, no cyanosis, R foot warm and well perfused Derm: scattered bruises on extensor surfaces of arms Neuro: A&Ox4, CN II-XII intact, strength 5/5 in all 4 extremeties   Labs and Imaging:  Reviewed, see A&P below.  Assessment and Plan:  77 y/o male with a-fib and multiple AAA repairs presented 11/28 for management of acute AAA rupture.  Remained hemodynamically stable throughout and did well with  endovascular repair.  Now recovering in 2300  AAA rupture: s/p endovascular repair.  Feet warm with good pulses this morning.   Plan:  -per vascular surgery -pull aline and foley today -PT following  Deconditioning Plan: -PT consult/rehab  A-fib: episode of RVR overnight, on dig at home Plan: -cardiology consult -dig/metop today per cards note, hopefully transition off cardizem gtt with that -hold warfarin  SSS: s/p medtronic pacer placed Plan: -cardiology consult  Hyperlipidemia: Plan: -restart zocor when taking po  Hct down 6 points this AM, no active signs of bleeding. Plan: -watch for groin hematoma with elevated INR, exam unchanged today -repeat Hct later today   Best practices / Disposition: Feeding/protein calorie malnutrition: npo while still having abdominal pain Analgesia: prn acetaminophen Sedation: n/a Thromboprophylaxis: scd HOB >30 degrees Ulcer prophylaxis: pepcid bid Glucose control: follow cbg  Will place order to remove a-line and CVL.  Would consider transfer to tele.  PCCM will sign off, call if questions.  Heber [place], M.D. Pulmonary and Critical Care Medicine [facility] Pager: [phone]  01/11/2011, 7:17 AM

## 2011-01-12 LAB — BASIC METABOLIC PANEL
Calcium: 8.1 mg/dL — ABNORMAL LOW (ref 8.4–10.5)
GFR calc Af Amer: 65 mL/min — ABNORMAL LOW (ref >90)
GFR calc non Af Amer: 56 mL/min — ABNORMAL LOW (ref >90)
Glucose, Bld: 133 mg/dL — ABNORMAL HIGH (ref 70–99)
Potassium: 4.2 mEq/L (ref 3.5–5.1)
Sodium: 135 mEq/L (ref 135–145)

## 2011-01-12 LAB — CBC
Hemoglobin: 10 g/dL — ABNORMAL LOW (ref 13.0–17.0)
MCH: 30.3 pg (ref 26.0–34.0)
Platelets: 92 10*3/uL — ABNORMAL LOW (ref 150–400)
RBC: 3.3 MIL/uL — ABNORMAL LOW (ref 4.22–5.81)
WBC: 7.2 10*3/uL (ref 4.0–10.5)

## 2011-01-12 LAB — PROTIME-INR: INR: 1.76 — ABNORMAL HIGH (ref 0.00–1.49)

## 2011-01-12 MED ORDER — KCL IN DEXTROSE-NACL 20-5-0.45 MEQ/L-%-% IV SOLN
INTRAVENOUS | Status: DC
  Administered 2011-01-12: 10:00:00 via INTRAVENOUS
  Filled 2011-01-12: qty 1000

## 2011-01-12 MED ORDER — DILTIAZEM HCL 60 MG PO TABS
60.0000 mg | ORAL_TABLET | Freq: Four times a day (QID) | ORAL | Status: AC
  Administered 2011-01-12 – 2011-01-13 (×6): 60 mg via ORAL
  Filled 2011-01-12 (×8): qty 1

## 2011-01-12 MED ORDER — METOPROLOL TARTRATE 25 MG PO TABS
25.0000 mg | ORAL_TABLET | Freq: Four times a day (QID) | ORAL | Status: DC
  Administered 2011-01-12 – 2011-01-13 (×4): 25 mg via ORAL
  Filled 2011-01-12 (×7): qty 1

## 2011-01-12 MED ORDER — WARFARIN SODIUM 5 MG PO TABS
5.0000 mg | ORAL_TABLET | Freq: Once | ORAL | Status: AC
  Administered 2011-01-12: 5 mg via ORAL
  Filled 2011-01-12: qty 1

## 2011-01-12 NOTE — Consult Note (Signed)
Subjective:  Denies SOB or CP. Objective: Filed Vitals:   01/11/11 2122 01/12/11 0000 01/12/11 0300 01/12/11 0813  BP: 124/60 120/54 142/59 142/46  Pulse: 70 70 75 78  Temp:  97.5 F (36.4 C) 97.5 F (36.4 C) 97.6 F (36.4 C)  TempSrc:  Oral Oral Oral  Resp:  19 18 17   Height:      Weight:      SpO2:  96% 96% 97%   Weight change:   Intake/Output Summary (Last 24 hours) at 01/12/11 1120 Last data filed at 01/12/11 1005  Gross per 24 hour  Intake   2558 ml  Output   1765 ml  Net    793 ml    Cardiac:  Irreg irreg.  S1, S2 Lungs:  Rel clear Ext:  No edema  Tele:  Afib with occasional paced  70s  Increases to 130s with walking. Lab Results: Results for orders placed during the hospital encounter of 01/09/11 (from the past 24 hour(s))  PROTIME-INR     Status: Abnormal   Collection Time   01/12/11  4:20 AM      Component Value Range   Prothrombin Time 20.8 (*) 11.6 - 15.2 (seconds)   INR 1.76 (*) 0.00 - 1.49   BASIC METABOLIC PANEL     Status: Abnormal   Collection Time   01/12/11  4:20 AM      Component Value Range   Sodium 135  135 - 145 (mEq/L)   Potassium 4.2  3.5 - 5.1 (mEq/L)   Chloride 106  96 - 112 (mEq/L)   CO2 26  19 - 32 (mEq/L)   Glucose, Bld 133 (*) 70 - 99 (mg/dL)   BUN 17  6 - 23 (mg/dL)   Creatinine, Ser 1.61  0.50 - 1.35 (mg/dL)   Calcium 8.1 (*) 8.4 - 10.5 (mg/dL)   GFR calc non Af Amer 56 (*) >90 (mL/min)   GFR calc Af Amer 65 (*) >90 (mL/min)  CBC     Status: Abnormal   Collection Time   01/12/11  4:20 AM      Component Value Range   WBC 7.2  4.0 - 10.5 (K/uL)   RBC 3.30 (*) 4.22 - 5.81 (MIL/uL)   Hemoglobin 10.0 (*) 13.0 - 17.0 (g/dL)   HCT 09.6 (*) 04.5 - 52.0 (%)   MCV 93.0  78.0 - 100.0 (fL)   MCH 30.3  26.0 - 34.0 (pg)   MCHC 32.6  30.0 - 36.0 (g/dL)   RDW 40.9 (*) 81.1 - 15.5 (%)   Platelets 92 (*) 150 - 400 (K/uL)    Studies/Results: No results found.  Medications: I have reviewed the patient's current medications.   Active  Problems:  Essential hypertension, benign  Will follow as adjust meds   Atrial fibrillation  Rates fairly controlled.  Still in afib.  Will switch dilt to PO  Increase lopressor.  Continue dig  Needs coumadin.  Discuss with surgery.  Tachycardia-bradycardia  S/p PPM   LOS: 3 days   Dietrich Pates 01/12/2011, 11:20 AM

## 2011-01-12 NOTE — Progress Notes (Signed)
ANTICOAGULATION CONSULT NOTE - Initial Consult  Pharmacy Consult for Coumadin Indication: atrial fibrillation  No Known Allergies  Patient Measurements: Height: 5\' 9"  (175.3 cm) Weight: 176 lb 2.4 oz (79.9 kg) IBW/kg (Calculated) : 70.7    Vital Signs: Temp: 97.6 F (36.4 C) (12/01 1200) Temp src: Oral (12/01 1200) BP: 127/44 mmHg (12/01 1200) Pulse Rate: 75  (12/01 1200)  Labs:  Basename 01/12/11 0420 01/11/11 1055 01/11/11 0345 01/10/11 0524 01/10/11 0419 01/09/11 1726  HGB 10.0* 10.7* -- -- -- --  HCT 30.7* 32.7* 31.0* -- -- --  PLT 92* -- 78* -- 103* --  APTT -- -- -- -- -- 37  LABPROT 20.8* -- 25.1* 25.1* -- --  INR 1.76* -- 2.23* 2.23* -- --  HEPARINUNFRC -- -- -- -- -- --  CREATININE 1.12 -- 1.22 -- 1.03 --  CKTOTAL -- -- -- -- -- --  CKMB -- -- -- -- -- --  TROPONINI -- -- -- -- -- --   Estimated Creatinine Clearance: 44.7 ml/min (by C-G formula based on Cr of 1.12).  Medical History: Past Medical History  Diagnosis Date  . Atrial fibrillation     paroxysmal  . Atrial flutter   . Unspecified essential hypertension   . Anal fissure   . Melanoma   . Hyperlipidemia   . PVD (peripheral vascular disease)     s/p fem-fem bypass grafting 2006  . AAA (abdominal aortic aneurysm)     s/p graft repair 2000  . SSS (sick sinus syndrome)     s/p Medtronic PPM implantation 07/2008  . Arthritis   . Mild claudication     Medications:  Prescriptions prior to admission  Medication Sig Dispense Refill  . allopurinol (ZYLOPRIM) 300 MG tablet Take 300 mg by mouth daily.        Marland Kitchen CALCIUM CARBONATE PO Take by mouth. Taking 1.5 Grams Daily      . digoxin (LANOXIN) 0.125 MG tablet Take 125 mcg by mouth daily.        . ergocalciferol (VITAMIN D2) 50000 UNITS capsule Take 50,000 Units by mouth every 30 (thirty) days. Take on the first on every month.      . finasteride (PROSCAR) 5 MG tablet Take 5 mg by mouth daily.        . fosinopril (MONOPRIL) 40 MG tablet Take 20 mg by  mouth daily. 1/2 tablet once daily      . metoprolol (LOPRESSOR) 50 MG tablet Take 25 mg by mouth 2 (two) times daily. 1/2 po bid      . simvastatin (ZOCOR) 20 MG tablet Take 20 mg by mouth at bedtime.        Marland Kitchen warfarin (COUMADIN) 2.5 MG tablet Take 2.5-5 mg by mouth daily. Take 2.5mg  on Tues and Thurs, and takes 5mg  on Sun, Mon, Wed, Fri, and Sat.        Assessment: 77 y/o male patient admitted with h/o afib on chronic coumadin, INR subtherapeutic. Will begin with home dose.   Goal of Therapy:  INR 2-3   Plan:  Coumadin 5mg  po today, f/u daily protime.  Verlene Mayer, PharmD, BCPS Pager [phone]  01/12/2011,3:07 PM

## 2011-01-12 NOTE — Progress Notes (Signed)
Patient ID: [name] [name], male   DOB: [birth year], 77 y.o.   MRN: [id number] Vascular Surgery Progress Note  Subjective: This 77 year old male is 2 days status post insertion of a Cook re-nu graft for a type I endoleak from a previously placed stent graft. Patient also has a functional right to left femoral-femoral bypass. He had a left brachial artery exposure as well. He is ambulating in the halls and has no specific complaints today. He has a good appetite. She tolerated breakfast well. He is voiding without difficulty. He does have chronic atrial fibrillation which is being managed by cardiology.  Objective:  Filed Vitals:   01/12/11 0813  BP: 142/46  Pulse: 78  Temp: 97.6 F (36.4 C)  Resp: 17   General alert and oriented x3 Lungs no rhonchi or wheezing Cardiovascular irregular rhythm no murmurs Abdomen soft nontender with no masses Right inguinal wound healing nicely 3+ femoral pulse Left brachial wound with mild/moderate hematoma with 3+ radial pulse   Labs:  Lab 01/12/11 0420 01/11/11 0345 01/10/11 0419  CREATININE 1.12 1.22 1.03    Lab 01/12/11 0420 01/11/11 0345 01/10/11 0419  NA 135 137 135  K 4.2 4.2 4.3  CL 106 106 103  CO2 26 24 23   BUN 17 18 17   CREATININE 1.12 1.22 1.03  LABGLOM -- -- --  GLUCOSE 133* -- --  CALCIUM 8.1* 8.1* 8.1*    Lab 01/12/11 0420 01/11/11 1055 01/11/11 0345 01/10/11 0419  WBC 7.2 -- 7.2 8.6  HGB 10.0* 10.7* 10.3* --  HCT 30.7* 32.7* 31.0* --  PLT 92* -- 78* 103*    Lab 01/12/11 0420 01/11/11 0345 01/10/11 0524  INR 1.76* 2.23* 2.23*    I/O last 3 completed shifts: In: 3533 [I.V.:3483; IV Piggyback:50] Out: 2415 [Urine:2415]  Imaging: No results found.  Assessment/Plan:  POD #2  LOS: 3 days  s/p Procedure(s): ENDOVASCULAR STENT GRAFT INSERTION  Doing well post redo aortic stent graft using Cook Renu graft. Beginning to ambulate in the hall with walker Renal function stable Right well-controlled with chronic atrial  fibrillation currently on Cardizem drip   Josephina Gip, MD 01/12/2011 10:20 AM

## 2011-01-13 LAB — TYPE AND SCREEN
Unit division: 0
Unit division: 0

## 2011-01-13 LAB — BASIC METABOLIC PANEL
Chloride: 102 mEq/L (ref 96–112)
GFR calc Af Amer: 64 mL/min — ABNORMAL LOW (ref >90)
GFR calc non Af Amer: 55 mL/min — ABNORMAL LOW (ref >90)
Potassium: 3.9 mEq/L (ref 3.5–5.1)
Sodium: 134 mEq/L — ABNORMAL LOW (ref 135–145)

## 2011-01-13 LAB — CBC
HCT: 32.6 % — ABNORMAL LOW (ref 39.0–52.0)
Hemoglobin: 10.7 g/dL — ABNORMAL LOW (ref 13.0–17.0)
MCHC: 32.8 g/dL (ref 30.0–36.0)
RBC: 3.52 MIL/uL — ABNORMAL LOW (ref 4.22–5.81)
WBC: 6.1 10*3/uL (ref 4.0–10.5)

## 2011-01-13 LAB — PROTIME-INR
INR: 1.24 (ref 0.00–1.49)
Prothrombin Time: 15.9 seconds — ABNORMAL HIGH (ref 11.6–15.2)

## 2011-01-13 MED ORDER — METOPROLOL TARTRATE 50 MG PO TABS
50.0000 mg | ORAL_TABLET | Freq: Two times a day (BID) | ORAL | Status: DC
  Administered 2011-01-13 – 2011-01-15 (×4): 50 mg via ORAL
  Filled 2011-01-13 (×5): qty 1

## 2011-01-13 MED ORDER — WARFARIN SODIUM 5 MG PO TABS
5.0000 mg | ORAL_TABLET | Freq: Once | ORAL | Status: AC
  Administered 2011-01-13: 5 mg via ORAL
  Filled 2011-01-13: qty 1

## 2011-01-13 MED ORDER — DILTIAZEM HCL ER COATED BEADS 240 MG PO CP24
240.0000 mg | ORAL_CAPSULE | Freq: Every day | ORAL | Status: DC
  Administered 2011-01-14 – 2011-01-15 (×2): 240 mg via ORAL
  Filled 2011-01-13 (×2): qty 1

## 2011-01-13 NOTE — Progress Notes (Signed)
Pt. Complained of abdominal pain during the night accompanied by flatus and a bowel movement.  Pt. Is currently not experiencing any pain;  Reported to me that the pain was mild; notified day nurse.   Van Clines 01/13/2011 7:47 AM

## 2011-01-13 NOTE — Progress Notes (Signed)
ANTICOAGULATION CONSULT NOTE - Follow Up Consult  Pharmacy Consult for Coumadin Indication: atrial fibrillation  No Known Allergies  Patient Measurements: Height: 5\' 9"  (175.3 cm) Weight: 176 lb 2.4 oz (79.9 kg) IBW/kg (Calculated) : 70.7   Vital Signs: Temp: 97.5 F (36.4 C) (12/02 0728) Temp src: Oral (12/02 0728) BP: 135/65 mmHg (12/02 0920) Pulse Rate: 70  (12/02 0920)  Labs:  Basename 01/13/11 0740 01/12/11 0420 01/11/11 1055 01/11/11 0345  HGB 10.7* 10.0* -- --  HCT 32.6* 30.7* 32.7* --  PLT 110* 92* -- 78*  APTT -- -- -- --  LABPROT 15.9* 20.8* -- 25.1*  INR 1.24 1.76* -- 2.23*  HEPARINUNFRC -- -- -- --  CREATININE 1.14 1.12 -- 1.22  CKTOTAL -- -- -- --  CKMB -- -- -- --  TROPONINI -- -- -- --   Estimated Creatinine Clearance: 43.9 ml/min (by C-G formula based on Cr of 1.14).   Medications:  Scheduled:    . antiseptic oral rinse  15 mL Mouth Rinse TID WC & HS  . digoxin  125 mcg Oral Daily  . diltiazem  60 mg Oral Q6H  . famotidine  20 mg Oral BID  . metoprolol  25 mg Oral QID  . sodium chloride  3 mL Intravenous Q12H  . warfarin  5 mg Oral ONCE-1800  . DISCONTD: metoprolol  25 mg Oral BID  . DISCONTD: sodium chloride  10 mL Intravenous Q12H    Assessment: 77 y/o male patient s/p aortic stent graft, on chronic coumadin for h/o afib. INR subtherapeutic, INR resumed yesterday. No bleeding reported.  Goal of Therapy:  INR 2-3   Plan:  Coumadin 5mg  today and f/u in am.  Verlene Mayer, PharmD, BCPS Pager [phone]  01/13/2011,10:04 AM

## 2011-01-13 NOTE — Progress Notes (Signed)
Patient ID: [name] [name], male   DOB: April 06, 1919, 77 y.o.   MRN: [id number] Vascular Surgery Progress Note  Subjective: Patient denies any complaints today. He is ambulating with help. He had some mild abdominal discomfort last p.m. which has now resolved. His appetite is good. He is passing urine without difficulty.  Objective:  Filed Vitals:   01/13/11 0920  BP: 135/65  Pulse: 70  Temp:   Resp:     General alert and oriented x3 in no apparent stress Chest no rhonchi or wheezing Cardiovascular irregular rhythm no murmurs Abdomen soft nontender Right inguinal wound healing nicely with 3+ femoral pulse Left brachial wound healing nicely with mild to moderate swelling no evidence of infection 2+ brachial pulse   Labs:  Lab 01/13/11 0740 01/12/11 0420 01/11/11 0345  CREATININE 1.14 1.12 1.22    Lab 01/13/11 0740 01/12/11 0420 01/11/11 0345  NA 134* 135 137  K 3.9 4.2 4.2  CL 102 106 106  CO2 24 26 24   BUN 22 17 18   CREATININE 1.14 1.12 1.22  LABGLOM -- -- --  GLUCOSE 111* -- --  CALCIUM 8.6 8.1* 8.1*    Lab 01/13/11 0740 01/12/11 0420 01/11/11 1055 01/11/11 0345  WBC 6.1 7.2 -- 7.2  HGB 10.7* 10.0* 10.7* --  HCT 32.6* 30.7* 32.7* --  PLT 110* 92* -- 78*    Lab 01/13/11 0740 01/12/11 0420 01/11/11 0345  INR 1.24 1.76* 2.23*    I/O last 3 completed shifts: In: 2243 [P.O.:960; I.V.:1283] Out: 2165 [Urine:2165]  Imaging: No results found.  Assessment/Plan:  POD #3  LOS: 4 days  s/p Procedure(s): ENDOVASCULAR STENT GRAFT INSERTION  Doing well post insertion of Renu-Cook stent graft to treat type I endoleak. Patient received one dose of Coumadin yesterday with normal-appearing INR today-Will resume Coumadin tomorrow if okay with Dr. Darrick Penna Likely to have CT angiogram performed tomorrow Continue increasing activity as tolerated Renal function and hemoglobin are stable  Josephina Gip, MD 01/13/2011 11:06 AM

## 2011-01-13 NOTE — Consult Note (Addendum)
Subjective: No SOB or CP or dizziness Objective: Filed Vitals:   01/13/11 0400 01/13/11 0620 01/13/11 0728 01/13/11 0920  BP: 139/61 154/71 135/65 135/65  Pulse: 72   70  Temp: 97.8 F (36.6 C)  97.5 F (36.4 C)   TempSrc: Oral  Oral   Resp: 17     Height:      Weight:      SpO2: 97%      Weight change:   Intake/Output Summary (Last 24 hours) at 01/13/11 1028 Last data filed at 01/13/11 0900  Gross per 24 hour  Intake    480 ml  Output   1400 ml  Net   -920 ml    General: Alert, awake, oriented x3, in no acute distress.   Heart: Irregular rate and rhythm, without murmurs, rubs, gallops.  Lungs: CTA EXT:  No edema  Lab Results: Results for orders placed during the hospital encounter of 01/09/11 (from the past 24 hour(s))  PROTIME-INR     Status: Abnormal   Collection Time   01/13/11  7:40 AM      Component Value Range   Prothrombin Time 15.9 (*) 11.6 - 15.2 (seconds)   INR 1.24  0.00 - 1.49   BASIC METABOLIC PANEL     Status: Abnormal   Collection Time   01/13/11  7:40 AM      Component Value Range   Sodium 134 (*) 135 - 145 (mEq/L)   Potassium 3.9  3.5 - 5.1 (mEq/L)   Chloride 102  96 - 112 (mEq/L)   CO2 24  19 - 32 (mEq/L)   Glucose, Bld 111 (*) 70 - 99 (mg/dL)   BUN 22  6 - 23 (mg/dL)   Creatinine, Ser 1.61  0.50 - 1.35 (mg/dL)   Calcium 8.6  8.4 - 09.6 (mg/dL)   GFR calc non Af Amer 55 (*) >90 (mL/min)   GFR calc Af Amer 64 (*) >90 (mL/min)  CBC     Status: Abnormal   Collection Time   01/13/11  7:40 AM      Component Value Range   WBC 6.1  4.0 - 10.5 (K/uL)   RBC 3.52 (*) 4.22 - 5.81 (MIL/uL)   Hemoglobin 10.7 (*) 13.0 - 17.0 (g/dL)   HCT 04.5 (*) 40.9 - 52.0 (%)   MCV 92.6  78.0 - 100.0 (fL)   MCH 30.4  26.0 - 34.0 (pg)   MCHC 32.8  30.0 - 36.0 (g/dL)   RDW 81.1 (*) 91.4 - 15.5 (%)   Platelets 110 (*) 150 - 400 (K/uL)    Studies/Results: No results found.  Medications: I have reviewed the patient's current medications.   Patient Active  Hospital Problem List: Essential hypertension, benign (01/10/2010)   Assessment: BP is fairly well controlled.   Plan: Follow on regimen for now. Atrial fibrillation (01/10/2010)   Assessment: Rates controlled on oral agents.  Coumadin started  WIll stop until OK'd by vascular surgery. Will consolidate medicine dosing to qd, bid.   Plan: Tachycardia-bradycardia (04/10/2010)   Assessment: PPM   Plan:    LOS: 4 days   Dietrich Pates 01/13/2011, 10:28 AM

## 2011-01-14 ENCOUNTER — Inpatient Hospital Stay (HOSPITAL_COMMUNITY): Payer: Medicare Other

## 2011-01-14 LAB — CBC
HCT: 31.9 % — ABNORMAL LOW (ref 39.0–52.0)
MCV: 92.5 fL (ref 78.0–100.0)
Platelets: 126 10*3/uL — ABNORMAL LOW (ref 150–400)
RBC: 3.45 MIL/uL — ABNORMAL LOW (ref 4.22–5.81)
RDW: 15.7 % — ABNORMAL HIGH (ref 11.5–15.5)
WBC: 5.2 10*3/uL (ref 4.0–10.5)

## 2011-01-14 LAB — PROTIME-INR
INR: 1.31 (ref 0.00–1.49)
Prothrombin Time: 16.5 seconds — ABNORMAL HIGH (ref 11.6–15.2)

## 2011-01-14 LAB — BASIC METABOLIC PANEL
CO2: 27 mEq/L (ref 19–32)
Chloride: 103 mEq/L (ref 96–112)
Creatinine, Ser: 1.11 mg/dL (ref 0.50–1.35)
GFR calc Af Amer: 66 mL/min — ABNORMAL LOW (ref >90)
Sodium: 137 mEq/L (ref 135–145)

## 2011-01-14 MED ORDER — SODIUM CHLORIDE 0.45 % IV SOLN
INTRAVENOUS | Status: DC
  Administered 2011-01-14 (×2): via INTRAVENOUS

## 2011-01-14 MED ORDER — WARFARIN SODIUM 5 MG PO TABS
5.0000 mg | ORAL_TABLET | Freq: Once | ORAL | Status: AC
  Administered 2011-01-14: 5 mg via ORAL
  Filled 2011-01-14: qty 1

## 2011-01-14 MED ORDER — IOHEXOL 350 MG/ML SOLN
100.0000 mL | Freq: Once | INTRAVENOUS | Status: AC | PRN
  Administered 2011-01-14: 100 mL via INTRAVENOUS

## 2011-01-14 NOTE — Progress Notes (Signed)
ANTICOAGULATION CONSULT NOTE - Follow Up Consult  Pharmacy Consult for coumadin Indication: atrial fibrillation  No Known Allergies  Patient Measurements: Height: 5\' 9"  (175.3 cm) Weight: 176 lb 2.4 oz (79.9 kg) IBW/kg (Calculated) : 70.7   Vital Signs: Temp: 97.4 F (36.3 C) (12/03 0800) Temp src: Oral (12/03 0800) BP: 142/60 mmHg (12/03 1017) Pulse Rate: 90  (12/03 1017)  Labs:  Basename 01/14/11 0500 01/13/11 0740 01/12/11 0420  HGB 10.5* 10.7* --  HCT 31.9* 32.6* 30.7*  PLT 126* 110* 92*  APTT -- -- --  LABPROT 16.5* 15.9* 20.8*  INR 1.31 1.24 1.76*  HEPARINUNFRC -- -- --  CREATININE 1.11 1.14 1.12  CKTOTAL -- -- --  CKMB -- -- --  TROPONINI -- -- --   Estimated Creatinine Clearance: 45.1 ml/min (by C-G formula based on Cr of 1.11).   Medications:  Scheduled:    . digoxin  125 mcg Oral Daily  . diltiazem  240 mg Oral Daily  . diltiazem  60 mg Oral Q6H  . famotidine  20 mg Oral BID  . metoprolol  50 mg Oral BID  . sodium chloride  3 mL Intravenous Q12H  . warfarin  5 mg Oral ONCE-1800  . warfarin  5 mg Oral ONCE-1800  . DISCONTD: antiseptic oral rinse  15 mL Mouth Rinse TID WC & HS  . DISCONTD: metoprolol  25 mg Oral QID    Assessment: 89 yom s/p arotic stent graft on chronic coumadin for history of afib. INR today is subtherapeutic at 1.31 after being held for surgery. No bleeding noted. Remains slightly anemic and thrombocytopenic but CBC is stable.   Goal of Therapy:  INR 2-3   Plan:  Repeat coumadin 5mg  PO x 1 F/u AM INR  Colletta Spillers, Drake Leach 01/14/2011,10:36 AM

## 2011-01-14 NOTE — Progress Notes (Addendum)
[facility] Progress Note  01/14/2011 7:18 AM POD 4  C/o some abdominal pain overnight.  He points to the midline lower abdomen.  He states that this got better after pain meds.  States it moves around from the mid-lower abdomen to his right groin, where he has had some incisional pain.  Walked this am without difficulty.  Filed Vitals:   01/14/11 0400  BP: 157/65  Pulse: 81  Temp: 97.8 F (36.6 C)  Resp: 17    Incisions:  Right groin incision is c/d/i without drainage.  There is some ecchymosis present. Extremities:  Palpable pedal pulses bilaterally. Abdomen:  Soft, NT to palpation; ND with +BS.  States he had BMs yesterday. Lungs:  CTAB Cardiac:  Irregular; rate controlled.   CBC    Component Value Date/Time   WBC 5.2 01/14/2011 0500   RBC 3.45* 01/14/2011 0500   HGB 10.5* 01/14/2011 0500   HCT 31.9* 01/14/2011 0500   PLT 126* 01/14/2011 0500   MCV 92.5 01/14/2011 0500   MCH 30.4 01/14/2011 0500   MCHC 32.9 01/14/2011 0500   RDW 15.7* 01/14/2011 0500   LYMPHSABS 1.4 01/09/2011 1015   MONOABS 0.6 01/09/2011 1015   EOSABS 0.1 01/09/2011 1015   BASOSABS 0.1 01/09/2011 1015    BMET    Component Value Date/Time   NA 137 01/14/2011 0500   K 4.2 01/14/2011 0500   CL 103 01/14/2011 0500   CO2 27 01/14/2011 0500   GLUCOSE 111* 01/14/2011 0500   BUN 28* 01/14/2011 0500   CREATININE 1.11 01/14/2011 0500   CALCIUM 8.4 01/14/2011 0500   GFRNONAA 57* 01/14/2011 0500   GFRAA 66* 01/14/2011 0500    INR    Component Value Date/Time   INR 1.31 01/14/2011 0500     Intake/Output Summary (Last 24 hours) at 01/14/11 0718 Last data filed at 01/14/11 0400  Gross per 24 hour  Intake   1440 ml  Output    950 ml  Net    490 ml     Assessment/Plan:  77 y.o. male is s/p endovascular stent graft insertion POD 4  Pt with abdominal pain overnight that improved with pain meds.  Pt received coumadin 5mg  yesterday.  INR is 1.31 today.  BUN has increased from yesterday  from 22 to 28 today and UOP is down slightly.  However, creatinine is stable.  Will d/w Dr. Darrick Penna about the CTA for today given the increased BUN.  Acute surgical blood loss anemia is stable.  Continue to monitor.  Newton Pigg, PA-C [facility] [phone] 01/14/2011 7:18 AM   History and exam details as above.  Overall looks well.  Will get CTA today.  If this looks reasonable we will restart his coumadin and plan for D/C tomorrow.  Will hydrate to protect kidneys.  Fabienne Bruns, MD [facility] [place] Office: [phone] Pager: [phone]

## 2011-01-15 LAB — PROTIME-INR: Prothrombin Time: 17.3 seconds — ABNORMAL HIGH (ref 11.6–15.2)

## 2011-01-15 LAB — BASIC METABOLIC PANEL
BUN: 20 mg/dL (ref 6–23)
Chloride: 105 mEq/L (ref 96–112)
GFR calc Af Amer: 74 mL/min — ABNORMAL LOW (ref >90)
Potassium: 3.9 mEq/L (ref 3.5–5.1)

## 2011-01-15 LAB — CBC
HCT: 32.9 % — ABNORMAL LOW (ref 39.0–52.0)
Hemoglobin: 10.9 g/dL — ABNORMAL LOW (ref 13.0–17.0)
MCHC: 33.1 g/dL (ref 30.0–36.0)
RBC: 3.57 MIL/uL — ABNORMAL LOW (ref 4.22–5.81)

## 2011-01-15 MED ORDER — DILTIAZEM HCL ER COATED BEADS 240 MG PO CP24: 240.0000 mg | ORAL_CAPSULE | Freq: Every day | ORAL | Status: DC

## 2011-01-15 MED ORDER — CEPHALEXIN 500 MG PO CAPS: 500.0000 mg | ORAL_CAPSULE | Freq: Two times a day (BID) | ORAL | Status: AC

## 2011-01-15 MED ORDER — HYDROCODONE-ACETAMINOPHEN 5-500 MG PO TABS: 1.0000 | ORAL_TABLET | Freq: Four times a day (QID) | ORAL | Status: AC | PRN

## 2011-01-15 NOTE — Discharge Summary (Signed)
[facility] Discharge Summary  [name] [name] 1920-01-13 77 y.o. male  [id number]  Admission Date: 01/09/2011  Discharge Date:   Physician: Larina Earthly, MD  Admission Diagnosis: Abdominal aortic aneurysm rupture [441.3] abd pain   HPI:   This is a 77 y.o. male who presents for evaluation of abdominal aortic aneurysm. He had a prior stent graft repair several years ago and presented to ER today with ruptured AAA by CT. Prior Fem Fem bypass as well. So far hemodynamically stable. Has flank pain for several hours.   Hospital Course:  The patient was admitted to the hospital and taken to the operating room on 01/09/2011 and underwent: 1. Left brachial artery exposure, cannulation and repair  2. Aortogram  3. Left arm angiogram  3. Placement of aortic conversion graft (Aortounibody) (see Dr. Evelina Dun note)  4. Placement of aortic proximal extension cuff (see Dr. Evelina Dun note)    The pt tolerated the procedure well and was transported to the PACU in good condition. Post operatively, he did require a cardizem gtt for rapid AFib.  Cardiology was asked to see the pt d/t his chronic AFib.  He did have some abdominal pain post operatively, but this was most likely from retroperitoneal hematoma.  His foley and A-line were removed on POD 1. On POD 2, he was transferred to the step down unit.  He continued to be coagulopathic despite no coumadin for several days.  This was held until his hemoglobin was stable and no endoleak confirmed by CT scan post operatively.  His diet was advanced on POD 2.  The pt did have a slight bump in his BUN, but he was hydrated prior to his CT Scan and his BUN/Cr on POD 5 are much improved at 20/1.01 respectively.  The remainder of the hospital course consisted of increasing ambulation and increasing intake of solids without difficulty.  CT Scan Results: IMPRESSION:  1. Large abdominal aortic aneurysm containing an aorto right iliac    endovascular stent without the previously seen endoleak.  2. Decreased soft tissue density around the abdominal aortic  aneurysm.  3. Chronically occluded left common iliac graft.  4. Stable markedly enlarged prostate gland.  5. Cholelithiasis.        Basename 01/15/11 0437 01/14/11 0500  NA 137 137  K 3.9 4.2  CL 105 103  CO2 25 27  GLUCOSE 108* 111*  BUN 20 28*  CALCIUM 8.4 8.4    Basename 01/15/11 0437 01/14/11 0500  WBC 5.1 5.2  HGB 10.9* 10.5*  HCT 32.9* 31.9*  PLT 124* 126*    Basename 01/15/11 0437 01/14/11 0500  INR 1.39 1.31     Discharge Instructions:   The patient is discharged to home with extensive instructions on wound care and progressive ambulation.  They are instructed not to drive or perform any heavy lifting until returning to see the physician in his office.   Discharge Orders    Future Appointments: Provider: Department: Dept Phone: Center:   09/10/2011 11:30 AM Larina Earthly, MD [facility] [phone] [facility]     Future Orders Please Complete By Expires   Resume previous diet      Driving Restrictions      Comments:   No driving for 4 weeks   Lifting restrictions      Comments:   No lifting for 6 weeks   Call MD for:  temperature >100.5      Call MD for:  redness, tenderness, or signs of infection (  pain, swelling, bleeding, redness, odor or green/yellow discharge around incision site)      Call MD for:  severe or increased pain, loss or decreased feeling  in affected limb(s)      ABDOMINAL PROCEDURE/ANEURYSM REPAIR/AORTO-BIFEMORAL BYPASS:  Call MD for increased abdominal pain; cramping diarrhea; nausea/vomiting      Increase activity slowly      Comments:   Walk with assistance use walker or cane as needed   May shower       Scheduling Instructions:   Shower daily with soap and water   may wash over wound with mild soap and water      Discharge instructions      Comments:   May use warm compresses on left upper arm incision 2-3 times  per day as needed.      Discharge Diagnosis:  Abdominal aortic aneurysm rupture [441.3] abd pain  Secondary Diagnosis: Patient Active Problem List  Diagnoses  . Essential hypertension, benign  . ATRIAL FLUTTER  . Atrial fibrillation  . Tachycardia-bradycardia  . Internal hemorrhoids without mention of complication   Past Medical History  Diagnosis Date  . Atrial fibrillation     paroxysmal  . Atrial flutter   . Unspecified essential hypertension   . Anal fissure   . Melanoma   . Hyperlipidemia   . PVD (peripheral vascular disease)     s/p fem-fem bypass grafting 2006  . AAA (abdominal aortic aneurysm)     s/p graft repair 2000  . SSS (sick sinus syndrome)     s/p Medtronic PPM implantation 07/2008  . Arthritis   . Mild claudication      [name] [name], [name] [name]  [facility] Medication Instructions [id number]   Printed on:01/15/11 0747  Medication Information                    allopurinol (ZYLOPRIM) 300 MG tablet Take 300 mg by mouth daily.             digoxin (LANOXIN) 0.125 MG tablet Take 125 mcg by mouth daily.             finasteride (PROSCAR) 5 MG tablet Take 5 mg by mouth daily.             fosinopril (MONOPRIL) 40 MG tablet Take 20 mg by mouth daily. 1/2 tablet once daily           metoprolol (LOPRESSOR) 50 MG tablet Take 25 mg by mouth 2 (two) times daily. 1/2 po bid           simvastatin (ZOCOR) 20 MG tablet Take 20 mg by mouth at bedtime.             ergocalciferol (VITAMIN D2) 50000 UNITS capsule Take 50,000 Units by mouth every 30 (thirty) days. Take on the first on every month.           CALCIUM CARBONATE PO Take by mouth. Taking 1.5 Grams Daily           warfarin (COUMADIN) 2.5 MG tablet Take 2.5-5 mg by mouth daily. Take 2.5mg  on Tues and Thurs, and takes 5mg  on Sun, Mon, Wed, Fri, and Sat.           cephALEXin (KEFLEX) 500 MG capsule Take 1 capsule (500 mg total) by mouth 2 (two) times daily.           HYDROcodone-acetaminophen (VICODIN) 5-500  MG per tablet Take 1 tablet by mouth every 6 (six) hours as needed for pain.  diltiazem (CARDIZEM CD) 240 MG 24 hr capsule Take 1 capsule (240 mg total) by mouth daily.             Disposition: home  Patient's condition: is Good   Newton Pigg, PA-C [facility] [phone] 01/15/2011  7:35 AM

## 2011-01-15 NOTE — Plan of Care (Signed)
Problem: Phase II Discharge Progression Outcomes Goal: Wound healing without s/s of infection Wound healing, no signs of infection

## 2011-01-15 NOTE — Progress Notes (Addendum)
[facility] Progress Note  01/15/2011 7:11 AM POD 5  Abdominal pain controlled/better  Filed Vitals:   01/15/11 0359  BP: 127/65  Pulse: 70  Temp: 97.9 F (36.6 C)  Resp: 18    Incisions:  Right groin incision c/d/i. No drainage.  Ecchymosis improving.  There is some erythema/edema around his LUA incision. Extremities:  BLE warm.  +palpable right DP pulse Abdomen:  Soft NT/ND; +BM yesterday Lungs:  CTAB  CBC    Component Value Date/Time   WBC 5.1 01/15/2011 0437   RBC 3.57* 01/15/2011 0437   HGB 10.9* 01/15/2011 0437   HCT 32.9* 01/15/2011 0437   PLT 124* 01/15/2011 0437   MCV 92.2 01/15/2011 0437   MCH 30.5 01/15/2011 0437   MCHC 33.1 01/15/2011 0437   RDW 15.5 01/15/2011 0437   LYMPHSABS 1.4 01/09/2011 1015   MONOABS 0.6 01/09/2011 1015   EOSABS 0.1 01/09/2011 1015   BASOSABS 0.1 01/09/2011 1015    BMET    Component Value Date/Time   NA 137 01/15/2011 0437   K 3.9 01/15/2011 0437   CL 105 01/15/2011 0437   CO2 25 01/15/2011 0437   GLUCOSE 108* 01/15/2011 0437   BUN 20 01/15/2011 0437   CREATININE 1.01 01/15/2011 0437   CALCIUM 8.4 01/15/2011 0437   GFRNONAA 64* 01/15/2011 0437   GFRAA 74* 01/15/2011 0437    INR    Component Value Date/Time   INR 1.39 01/15/2011 0437    Intake/Output Summary (Last 24 hours) at 01/15/11 0711 Last data filed at 01/15/11 0600  Gross per 24 hour  Intake   2985 ml  Output   2100 ml  Net    885 ml    CT Scan results: IMPRESSION:  1. Large abdominal aortic aneurysm containing an aorto right iliac  endovascular stent without the previously seen endoleak.  2. Decreased soft tissue density around the abdominal aortic  aneurysm.  3. Chronically occluded left common iliac graft.  4. Stable markedly enlarged prostate gland.  5. Cholelithiasis.   Assessment/Plan:  77 y.o. male is s/p Endovascular stent graft POD 5 Looks good this am. BUN/Cr improved. Acute surgical blood loss anemia stable/improving CT scan reveals  no endoleak Continue coumadin D/c home today. Will start Keflex 500 mg bid for possible mild LUA cellulitis.   Newton Pigg, PA-C [facility] [phone] 01/15/2011 7:11 AM   Pt feels well. CT angio shows patent renals with no type I or II endoleak.  Incisions overall healing in right groin although some swelling left brachial incision.  Will d/c home today  Resume prior coumadin dose F/U with me 2-3 weeks to check incisions Needs f/u CT in 3 months  Fabienne Bruns, MD [facility] [place] Office: [phone] Pager: [phone]

## 2011-01-15 NOTE — Plan of Care (Signed)
Problem: Phase II Discharge Progression Outcomes Goal: Discharge plan in place and appropriate Discharge plan in place, pt schedule to d'c home 01/15/11 per MD order.

## 2011-01-23 ENCOUNTER — Encounter: Payer: Self-pay | Admitting: Vascular Surgery

## 2011-01-24 ENCOUNTER — Ambulatory Visit (INDEPENDENT_AMBULATORY_CARE_PROVIDER_SITE_OTHER): Payer: Medicare Other | Admitting: Vascular Surgery

## 2011-01-24 ENCOUNTER — Encounter: Payer: Self-pay | Admitting: Vascular Surgery

## 2011-01-24 VITALS — BP 120/69 | HR 73 | Resp 16 | Ht 69.0 in | Wt 171.0 lb

## 2011-01-24 DIAGNOSIS — I714 Abdominal aortic aneurysm, without rupture, unspecified: Secondary | ICD-10-CM | POA: Insufficient documentation

## 2011-01-24 DIAGNOSIS — Z8679 Personal history of other diseases of the circulatory system: Secondary | ICD-10-CM | POA: Insufficient documentation

## 2011-01-24 DIAGNOSIS — Z95828 Presence of other vascular implants and grafts: Secondary | ICD-10-CM

## 2011-01-24 NOTE — Progress Notes (Signed)
The patient returns for followup today after repair of a ruptured aneurysm on 01/09/2011. The patient had a previous stent graft placed several years ago and had what appeared to be migration of a type I endoleak of this. This was repaired with a Cook Renu graft. He had a fairly uneventful hospital course. He returns today for further followup. He complains of some occasional pain in his right anterior thigh. Otherwise he feels well other than deconditioning. He is able to walk 5-10 minutes before becoming fatigued. His CT scan prior to leaving the hospital showed no evidence of endoleak and improvement of his retroperitoneal hematoma. He states that his abdominal or back pain has completely resolved at this point.  Physical exam: Filed Vitals:   01/24/11 1155  BP: 120/69  Pulse: 73  Resp: 16  Height: 5\' 9"  (1.753 m)  Weight: 171 lb (77.565 kg)  SpO2: 97%   Abdomen: Soft nontender nondistended no obvious pulsatile mass  Right groin incision is healing well with no erythema  Left brachial exposure incision is healing well there still a small amount of hematoma but there is no obvious infection He has 2+ radial pulse on the left has a 2+ posterior tibial pulse in the right foot his fem-fem bypass is patent.  Assessment: Doing well status post ruptured aneurysm repair with Renu graft  Plan: He needs a followup CT angiogram in 10 weeks. He has had some renal insufficiency in the past we'll see what his creatinine looks like before we give him a contrast load. However he did tolerate this well when he was in the hospital.

## 2011-01-25 NOTE — Progress Notes (Signed)
Addended by: Sharee Pimple on: 01/25/2011 12:03 PM   Modules accepted: Orders

## 2011-02-07 ENCOUNTER — Ambulatory Visit: Payer: Medicare Other | Admitting: Vascular Surgery

## 2011-02-18 ENCOUNTER — Other Ambulatory Visit (HOSPITAL_COMMUNITY): Payer: Self-pay | Admitting: Internal Medicine

## 2011-02-18 DIAGNOSIS — R1032 Left lower quadrant pain: Secondary | ICD-10-CM

## 2011-02-18 DIAGNOSIS — G8929 Other chronic pain: Secondary | ICD-10-CM

## 2011-02-18 DIAGNOSIS — R109 Unspecified abdominal pain: Secondary | ICD-10-CM

## 2011-02-22 ENCOUNTER — Ambulatory Visit (HOSPITAL_COMMUNITY)
Admission: RE | Admit: 2011-02-22 | Discharge: 2011-02-22 | Disposition: A | Discharge status: Home / Self Care | Payer: Medicare Other | Source: Ambulatory Visit | Attending: Internal Medicine | Admitting: Internal Medicine

## 2011-02-22 DIAGNOSIS — R1032 Left lower quadrant pain: Secondary | ICD-10-CM | POA: Insufficient documentation

## 2011-02-22 DIAGNOSIS — G8929 Other chronic pain: Secondary | ICD-10-CM

## 2011-02-25 ENCOUNTER — Other Ambulatory Visit: Payer: Self-pay | Admitting: Cardiology

## 2011-02-25 NOTE — Telephone Encounter (Signed)
New Problem   Patient received prescription for diliatzem when he had emergency surgery.  Please confirm if patient needs to continue meds or was it a once time medication

## 2011-02-26 MED ORDER — DILTIAZEM HCL ER COATED BEADS 240 MG PO CP24: 240.0000 mg | ORAL_CAPSULE | Freq: Every day | ORAL | Status: DC

## 2011-02-26 NOTE — Telephone Encounter (Signed)
Spoke with Dr. [place] patient should continue Diltiazem 240 mg daily.Called to [facility] at [address] refills.

## 2011-04-01 ENCOUNTER — Other Ambulatory Visit: Payer: Self-pay | Admitting: Vascular Surgery

## 2011-04-01 LAB — BUN: BUN: 26 mg/dL — ABNORMAL HIGH (ref 6–23)

## 2011-04-01 LAB — CREATININE, SERUM: Creat: 1.26 mg/dL (ref 0.50–1.35)

## 2011-04-03 ENCOUNTER — Encounter: Payer: Self-pay | Admitting: Vascular Surgery

## 2011-04-04 ENCOUNTER — Encounter: Payer: Self-pay | Admitting: Vascular Surgery

## 2011-04-04 ENCOUNTER — Ambulatory Visit (INDEPENDENT_AMBULATORY_CARE_PROVIDER_SITE_OTHER): Payer: Medicare Other | Admitting: Vascular Surgery

## 2011-04-04 ENCOUNTER — Ambulatory Visit
Admission: RE | Admit: 2011-04-04 | Discharge: 2011-04-04 | Disposition: A | Discharge status: Home / Self Care | Payer: Medicare Other | Source: Ambulatory Visit | Attending: Vascular Surgery | Admitting: Vascular Surgery

## 2011-04-04 VITALS — BP 169/80 | HR 103 | Resp 20 | Ht 69.0 in | Wt 168.0 lb

## 2011-04-04 DIAGNOSIS — I714 Abdominal aortic aneurysm, without rupture: Secondary | ICD-10-CM

## 2011-04-04 DIAGNOSIS — Z95828 Presence of other vascular implants and grafts: Secondary | ICD-10-CM

## 2011-04-04 MED ORDER — IOHEXOL 300 MG/ML  SOLN
100.0000 mL | Freq: Once | INTRAMUSCULAR | Status: AC | PRN
  Administered 2011-04-04: 100 mL via INTRAVENOUS

## 2011-04-04 NOTE — Progress Notes (Signed)
The patient returns for followup today after repair of a ruptured aneurysm on 01/09/2011. The patient had a previous stent graft placed several years ago and had what appeared to be migration of a type I endoleak of this. This was repaired with a Cook Renu graft. He returns today for further followup. He denies any abdominal or back pain. He has been ambulatory and denies any claudication symptoms.  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 However it is difficult to interpret due to previous multiple prior coil embolization. There does not appear to be any proximal type I endoleak. The proximal stent is above the renal arteries with the fabric flush with the inferior portion of the renal arteries, femoral-femoral bypass is patent, aneurysm measures 10.9 cm in diameter  Assessment: Patent redo stent graft abdominal aorta with 10.9 cm diameter aneurysm CT showing type II endoleak without any type I endoleak And patent femoral femoral bypass  Plan: Continued close surveillance. I discussed with the patient and his son today the type II endoleak and how the course of these usually benign. Most likely his cause of ruptured aneurysm was a Type I endoleak proximally. We have a good seal proximally at this time. He will have a repeat CT angiogram in 3 months.  Fabienne Bruns, MD [facility] [place] Office: [phone] Pager: [phone]

## 2011-04-04 NOTE — Progress Notes (Signed)
Addended by: Sharee Pimple on: 04/04/2011 03:12 PM   Modules accepted: Orders

## 2011-04-12 ENCOUNTER — Ambulatory Visit (INDEPENDENT_AMBULATORY_CARE_PROVIDER_SITE_OTHER): Payer: Medicare Other | Admitting: Cardiology

## 2011-04-12 ENCOUNTER — Encounter: Payer: Self-pay | Admitting: Cardiology

## 2011-04-12 VITALS — BP 131/65 | HR 71 | Ht 69.0 in | Wt 174.0 lb

## 2011-04-12 DIAGNOSIS — I4891 Unspecified atrial fibrillation: Secondary | ICD-10-CM

## 2011-04-12 DIAGNOSIS — I4892 Unspecified atrial flutter: Secondary | ICD-10-CM

## 2011-04-12 DIAGNOSIS — I495 Sick sinus syndrome: Secondary | ICD-10-CM

## 2011-04-12 NOTE — Patient Instructions (Signed)
Continue your current therapy and keep your scheduled pacemaker follow up.  I will see you again in one year.

## 2011-04-12 NOTE — Progress Notes (Signed)
[name] [name] Date of Birth: November 21, 1919   History of Present Illness: [name] [name] is seen for followup. He well. He denies any significant palpitations, dizziness, or syncope. He said chest pain or shortness of breath. He's had no bleeding problems on Coumadin. His Coumadin is now being followed at [facility].  Current Outpatient Prescriptions on File Prior to Visit  Medication Sig Dispense Refill  . allopurinol (ZYLOPRIM) 300 MG tablet Take 300 mg by mouth daily.        Marland Kitchen CALCIUM CARBONATE PO Take by mouth. Taking 1.5 Grams Daily      . digoxin (LANOXIN) 0.125 MG tablet Take 125 mcg by mouth daily.        Marland Kitchen diltiazem (CARDIZEM CD) 240 MG 24 hr capsule Take 1 capsule (240 mg total) by mouth daily.  90 capsule  3  . ergocalciferol (VITAMIN D2) 50000 UNITS capsule Take 50,000 Units by mouth every 30 (thirty) days. Take on the first on every month.      . finasteride (PROSCAR) 5 MG tablet Take 5 mg by mouth daily.        . fosinopril (MONOPRIL) 40 MG tablet Take 20 mg by mouth daily. 1/2 tablet once daily      . metoprolol (LOPRESSOR) 50 MG tablet Take 25 mg by mouth 2 (two) times daily. 1/2 po bid      . simvastatin (ZOCOR) 20 MG tablet Take 20 mg by mouth at bedtime.        Marland Kitchen warfarin (COUMADIN) 2.5 MG tablet Take 2.5-5 mg by mouth daily. Take 2.5mg  on Tues and Thurs, and takes 5mg  on Sun, Mon, Wed, Fri, and Sat.        No Known Allergies  Past Medical History  Diagnosis Date  . Atrial fibrillation     paroxysmal  . Atrial flutter   . Unspecified essential hypertension   . Anal fissure   . Melanoma   . Hyperlipidemia   . PVD (peripheral vascular disease)     s/p fem-fem bypass grafting 2006  . AAA (abdominal aortic aneurysm)     s/p graft repair 2000  . SSS (sick sinus syndrome)     s/p Medtronic PPM implantation 07/2008  . Arthritis   . Mild claudication     Past Surgical History  Procedure Date  . Melanoma excision     4 times  . Cataract extraction     . Hernia repair   . Pacemaker insertion   . Tonsillectomy   . Abdominal aortic aneurysm repair   . Aortic stent graft   . US echocardiography 07/15/2008    EF 55-60%  . Endovascular stent insertion 01/09/2011    Procedure: ENDOVASCULAR STENT GRAFT INSERTION;  Surgeon: Sherren Kerns, MD;  Location: [facility] OR;  Service: Vascular;  Laterality: Right;  With Left  Brachial artery cut-down    History  Smoking status  . Former Smoker -- 32 years  . Types: Cigarettes  . Quit date: 02/12/1971  Smokeless tobacco  . Never Used    History  Alcohol Use  . Yes    occasionally    Family History  Problem Relation Age of Onset  . Breast cancer Daughter     granddaughter  . Diabetes Mother   . Liver disease Mother     Review of Systems: As noted in history of present illness. All other systems were reviewed and are negative.  Physical Exam: BP 131/65  Pulse 71  Ht 5\' 9"  (1.753 m)  Wt 174 lb (78.926 kg)  BMI 25.70 kg/m2 He is a pleasant elderly white male in no acute distress. He is normocephalic, atraumatic. Pupils are equal round and reactive to light and accommodation. Extraocular movements are full. Oropharynx is clear. Neck is supple no JVD, adenopathy, thyromegaly, or bruits. Lungs are clear. Cardiac exam reveals a regular rate and rhythm with a soft systolic ejection murmur. His pacemaker site is stable. Abdomen is soft and nontender without masses or bruits. He has trace ankle edema. Pedal pulses are good. Skin is warm and dry. He is alert and oriented x3. Cranial nerves II through XII are intact. LABORATORY DATA:   Assessment / Plan:

## 2011-04-12 NOTE — Assessment & Plan Note (Signed)
His rate is well controlled on digoxin and metoprolol. He is on chronic Coumadin for anticoagulation. Continue with his current therapy and followup again in one year. He has a scheduled followup in our pacemaker clinic next month.

## 2011-04-18 ENCOUNTER — Ambulatory Visit: Payer: Medicare Other | Admitting: Vascular Surgery

## 2011-04-29 ENCOUNTER — Telehealth: Payer: Self-pay | Admitting: Cardiology

## 2011-04-29 NOTE — Telephone Encounter (Signed)
LOV,Cath,PAcer Check,12 faxed to [facility]  @ [phone] 04/29/11/KM

## 2011-05-09 ENCOUNTER — Ambulatory Visit (INDEPENDENT_AMBULATORY_CARE_PROVIDER_SITE_OTHER): Payer: Medicare Other | Admitting: *Deleted

## 2011-05-09 ENCOUNTER — Encounter: Payer: Self-pay | Admitting: Internal Medicine

## 2011-05-09 DIAGNOSIS — I495 Sick sinus syndrome: Secondary | ICD-10-CM

## 2011-05-09 LAB — PACEMAKER DEVICE OBSERVATION
AL AMPLITUDE: 1.7023 mv
AL IMPEDENCE PM: 456 Ohm
BAMS-0001: 170 {beats}/min
BATTERY VOLTAGE: 2.99 V
RV LEAD AMPLITUDE: 5.9296 mv
VENTRICULAR PACING PM: 7.83

## 2011-05-09 NOTE — Progress Notes (Signed)
PPM check 

## 2011-07-02 ENCOUNTER — Other Ambulatory Visit: Payer: Self-pay | Admitting: Vascular Surgery

## 2011-07-03 ENCOUNTER — Encounter: Payer: Self-pay | Admitting: Vascular Surgery

## 2011-07-04 ENCOUNTER — Encounter: Payer: Self-pay | Admitting: Vascular Surgery

## 2011-07-04 ENCOUNTER — Ambulatory Visit
Admission: RE | Admit: 2011-07-04 | Discharge: 2011-07-04 | Disposition: A | Discharge status: Home / Self Care | Payer: Medicare Other | Source: Ambulatory Visit | Attending: Vascular Surgery | Admitting: Vascular Surgery

## 2011-07-04 ENCOUNTER — Ambulatory Visit (INDEPENDENT_AMBULATORY_CARE_PROVIDER_SITE_OTHER): Payer: Medicare Other | Admitting: Vascular Surgery

## 2011-07-04 VITALS — BP 141/69 | HR 74 | Resp 14 | Ht 69.0 in | Wt 172.5 lb

## 2011-07-04 DIAGNOSIS — Z8679 Personal history of other diseases of the circulatory system: Secondary | ICD-10-CM | POA: Insufficient documentation

## 2011-07-04 DIAGNOSIS — I714 Abdominal aortic aneurysm, without rupture: Secondary | ICD-10-CM

## 2011-07-04 DIAGNOSIS — Z9889 Other specified postprocedural states: Secondary | ICD-10-CM

## 2011-07-04 MED ORDER — IOHEXOL 350 MG/ML SOLN
100.0000 mL | Freq: Once | INTRAVENOUS | Status: AC | PRN
  Administered 2011-07-04: 100 mL via INTRAVENOUS

## 2011-07-04 NOTE — Progress Notes (Signed)
[facility] [place] HISTORY AND PHYSICAL    History of Present Illness:  Patient is a 78 y.o.78 y.o. year old male who presents for follow-up evaluation of AAA. He underwent Cook Renu aneurysm stent graft repair in Nov 2012 for ruptured aneurysm with prior stent graft elsewhere. He also had a fem-fem bypass with his original stent graft  The patient denies new abdominal or back pain.  The patient's atherosclerotic risk factors remain atrial fibrillation, hyperlipidemia. Other medical problems include arthritis and he is minimally ambulatory secondary to this These are all currently stable and followed by his primary care physician.   Past Medical History  Diagnosis Date  . Atrial fibrillation     paroxysmal  . Atrial flutter   . Unspecified essential hypertension   . Anal fissure   . Melanoma   . Hyperlipidemia   . PVD (peripheral vascular disease)     s/p fem-fem bypass grafting 2006  . AAA (abdominal aortic aneurysm)     s/p graft repair 2000  . SSS (sick sinus syndrome)     s/p Medtronic PPM implantation 07/2008  . Arthritis   . Mild claudication      Past Surgical History  Procedure Date  . Melanoma excision     4 times  . Cataract extraction   . Hernia repair   . Pacemaker insertion   . Tonsillectomy   . Abdominal aortic aneurysm repair   . Aortic stent graft   . US echocardiography 07/15/2008    EF 55-60%  . Endovascular stent insertion 01/09/2011    Procedure: ENDOVASCULAR STENT GRAFT INSERTION;  Surgeon: Sherren Kerns, MD;  Location: [facility] OR;  Service: Vascular;  Laterality: Right;  With Left  Brachial artery cut-down       Review of Systems:  Neurologic: denies symptoms of TIA, amaurosis, or stroke Cardiac:denies shortness of breath or chest pain Pulmonary: denies cough or wheeze Abdomen: denies abdominal pain nausea or vomiting  History   Social History  . Marital Status: Married    Spouse Name: N/A    Number of Children: 3  . Years  of Education: N/A   Occupational History  . retired Hotel manager    Social History Main Topics  . Smoking status: Former Smoker -- 32 years    Types: Cigarettes    Quit date: 02/12/1971  . Smokeless tobacco: Never Used  . Alcohol Use: Yes     occasionally  . Drug Use: No  . Sexually Active: Not on file   Other Topics Concern  . Not on file   Social History Narrative  . No narrative on file    No Known Allergies  Current Outpatient Prescriptions on File Prior to Visit  Medication Sig Dispense Refill  . allopurinol (ZYLOPRIM) 300 MG tablet Take 300 mg by mouth daily.        Marland Kitchen CALCIUM CARBONATE PO Take by mouth. Taking 1.5 Grams Daily      . digoxin (LANOXIN) 0.125 MG tablet Take 125 mcg by mouth daily.        Marland Kitchen diltiazem (CARDIZEM CD) 240 MG 24 hr capsule Take 1 capsule (240 mg total) by mouth daily.  90 capsule  3  . ergocalciferol (VITAMIN D2) 50000 UNITS capsule Take 50,000 Units by mouth every 30 (thirty) days. Take on the first on every month.      . finasteride (PROSCAR) 5 MG tablet Take 5 mg by mouth daily.        . fosinopril (  MONOPRIL) 40 MG tablet Take 20 mg by mouth daily. 1/2 tablet once daily      . metoprolol (LOPRESSOR) 50 MG tablet Take 25 mg by mouth 2 (two) times daily. 1/2 po bid      . simvastatin (ZOCOR) 20 MG tablet Take 20 mg by mouth at bedtime.        Marland Kitchen warfarin (COUMADIN) 2.5 MG tablet Take 2.5-5 mg by mouth daily. Take 2.5mg  on Tues and Thurs, and takes 5mg  on Sun, Mon, Wed, Fri, and Sat.       No current facility-administered medications on file prior to visit.       Physical Examination    Filed Vitals:   07/04/11 1302  BP: 141/69  Pulse: 74  Resp: 14  Height: 5\' 9"  (1.753 m)  Weight: 172 lb 8 oz (78.245 kg)  SpO2: 95%     General:  Alert and oriented, no acute distress HEENT: Normal Neck: No bruit or JVD Pulmonary: Clear to auscultation bilaterally Cardiac: Regular Rate and Rhythm without murmur Abdomen: Soft, non-tender,  non-distended, normal bowel sounds, no pulsatile mass Extremities: 2+ femoral pulses   DATA:  CT angiogram the abdomen and pelvis images were reviewed today.  Current aneurysm diameter is  10 cm.  There no  evidence of a type I proximal endoleak. There is a small type II endoleak.  The top portion of the stent graft is adjacent to the superior mesenteric artery and there is no evidence of migration. There is good flow to the left and right renal artery   ASSESSMENT:  Doing well status post Cook Renu stent graft repair of a ruptured aneurysm   PLAN: Patient will return in 6 months to review his stent graft with a repeat CT Angio  Fabienne Bruns, MD [facility] [place] Office: [phone] Pager: [phone]

## 2011-08-16 ENCOUNTER — Encounter: Payer: Self-pay | Admitting: Internal Medicine

## 2011-08-16 ENCOUNTER — Ambulatory Visit (INDEPENDENT_AMBULATORY_CARE_PROVIDER_SITE_OTHER): Payer: Medicare Other | Admitting: *Deleted

## 2011-08-16 DIAGNOSIS — I495 Sick sinus syndrome: Secondary | ICD-10-CM

## 2011-08-19 LAB — REMOTE PACEMAKER DEVICE
AL IMPEDENCE PM: 576 Ohm
ATRIAL PACING PM: 88.15
BATTERY VOLTAGE: 2.99 V
RV LEAD AMPLITUDE: 6.6272 mv
VENTRICULAR PACING PM: 9.56

## 2011-08-26 ENCOUNTER — Encounter: Payer: Self-pay | Admitting: *Deleted

## 2011-09-10 ENCOUNTER — Ambulatory Visit: Payer: Medicare Other | Admitting: Vascular Surgery

## 2011-11-19 ENCOUNTER — Encounter: Payer: Self-pay | Admitting: *Deleted

## 2011-11-19 DIAGNOSIS — Z950 Presence of cardiac pacemaker: Secondary | ICD-10-CM | POA: Insufficient documentation

## 2011-11-25 ENCOUNTER — Ambulatory Visit (INDEPENDENT_AMBULATORY_CARE_PROVIDER_SITE_OTHER): Payer: Medicare Other | Admitting: Internal Medicine

## 2011-11-25 ENCOUNTER — Encounter: Payer: Self-pay | Admitting: Internal Medicine

## 2011-11-25 VITALS — BP 136/78 | HR 74 | Ht 69.0 in | Wt 175.0 lb

## 2011-11-25 DIAGNOSIS — Z950 Presence of cardiac pacemaker: Secondary | ICD-10-CM

## 2011-11-25 DIAGNOSIS — I4892 Unspecified atrial flutter: Secondary | ICD-10-CM

## 2011-11-25 DIAGNOSIS — I495 Sick sinus syndrome: Secondary | ICD-10-CM

## 2011-11-25 DIAGNOSIS — I4891 Unspecified atrial fibrillation: Secondary | ICD-10-CM

## 2011-11-25 LAB — PACEMAKER DEVICE OBSERVATION
ATRIAL PACING PM: 83.83
BATTERY VOLTAGE: 2.97 V
BRDY-0002RV: 70 {beats}/min
BRDY-0003RV: 130 {beats}/min
BRDY-0004RV: 130 {beats}/min
VENTRICULAR PACING PM: 12.86

## 2011-11-25 NOTE — Patient Instructions (Signed)
Your physician wants you to follow-up in: 12 months with Dr Allred You will receive a reminder letter in the mail two months in advance. If you don't receive a letter, please call our office to schedule the follow-up appointment.  

## 2011-11-25 NOTE — Progress Notes (Signed)
PCP: Garlan Fillers, MD Primary Cardiologist:  Dr [place]  [name] [name] is a 78 y.o. male who presents today for routine electrophysiology followup.  Since last being seen in our clinic, the patient reports doing very well.  Today, he denies symptoms of palpitations, chest pain, shortness of breath, presyncope, or syncope.  He has stable mild edema.  The patient is otherwise without complaint today.   Past Medical History  Diagnosis Date  . Atrial fibrillation     paroxysmal  . Atrial flutter   . Unspecified essential hypertension   . Anal fissure   . Melanoma   . Hyperlipidemia   . PVD (peripheral vascular disease)     s/p fem-fem bypass grafting 2006  . AAA (abdominal aortic aneurysm)     s/p graft repair 2000  . SSS (sick sinus syndrome)     s/p Medtronic PPM implantation 07/2008  . Arthritis   . Mild claudication    Past Surgical History  Procedure Date  . Melanoma excision     4 times  . Cataract extraction   . Hernia repair   . Pacemaker insertion   . Tonsillectomy   . Abdominal aortic aneurysm repair   . Aortic stent graft   . US echocardiography 07/15/2008    EF 55-60%  . Endovascular stent insertion 01/09/2011    Procedure: ENDOVASCULAR STENT GRAFT INSERTION;  Surgeon: Sherren Kerns, MD;  Location: [facility] OR;  Service: Vascular;  Laterality: Right;  With Left  Brachial artery cut-down    Current Outpatient Prescriptions  Medication Sig Dispense Refill  . allopurinol (ZYLOPRIM) 300 MG tablet Take 300 mg by mouth daily.        Marland Kitchen CALCIUM CARBONATE PO Take by mouth. Taking 1.5 Grams Daily      . digoxin (LANOXIN) 0.125 MG tablet Take 125 mcg by mouth daily.        Marland Kitchen diltiazem (CARDIZEM CD) 240 MG 24 hr capsule Take 1 capsule (240 mg total) by mouth daily.  90 capsule  3  . ergocalciferol (VITAMIN D2) 50000 UNITS capsule Take 50,000 Units by mouth every 30 (thirty) days. Take on the first on every month.      . finasteride (PROSCAR) 5 MG tablet Take 5 mg by mouth  daily.        . fosinopril (MONOPRIL) 40 MG tablet Take 20 mg by mouth daily. 1/2 tablet once daily      . metoprolol (LOPRESSOR) 50 MG tablet Take 25 mg by mouth 2 (two) times daily. 1/2 po bid      . simvastatin (ZOCOR) 20 MG tablet Take 20 mg by mouth at bedtime.        Marland Kitchen warfarin (COUMADIN) 2.5 MG tablet Take 2.5-5 mg by mouth daily. Take 2.5mg  on Tues and Thurs, and takes 5mg  on Sun, Mon, Wed, Fri, and Sat.        Physical Exam: Filed Vitals:   11/25/11 1104  BP: 136/78  Pulse: 74  Height: 5\' 9"  (1.753 m)  Weight: 175 lb (79.379 kg)    GEN- The patient is well appearing, alert and oriented x 3 today.   Head- normocephalic, atraumatic Eyes-  Sclera clear, conjunctiva pink Ears- hearing intact Oropharynx- clear Lungs- Clear to ausculation bilaterally, normal work of breathing Chest- pacemaker pocket is well healed Heart- irregular rate and rhythm, no murmurs, rubs or gallops, PMI not laterally displaced GI- soft, NT, ND, + BS Extremities- no clubbing, cyanosis, or edema  Pacemaker interrogation- reviewed in detail today,  See PACEART report  Assessment and Plan:  1. Bradycardia Normal pacemaker function See Pace Art report No changes today  2. Afib/ atrial flutter Rate controlled Continue coumadin long term  Continue carelink every 3 months Return in 1 year to the device clinic

## 2011-12-02 ENCOUNTER — Other Ambulatory Visit: Payer: Self-pay | Admitting: *Deleted

## 2011-12-02 MED ORDER — DILTIAZEM HCL ER COATED BEADS 240 MG PO CP24: 240.0000 mg | ORAL_CAPSULE | Freq: Every day | ORAL | Status: DC

## 2012-01-15 ENCOUNTER — Other Ambulatory Visit: Payer: Self-pay | Admitting: Vascular Surgery

## 2012-01-15 ENCOUNTER — Encounter: Payer: Self-pay | Admitting: Vascular Surgery

## 2012-01-15 LAB — CREATININE, SERUM: Creat: 1.08 mg/dL (ref 0.50–1.35)

## 2012-01-16 ENCOUNTER — Ambulatory Visit
Admission: RE | Admit: 2012-01-16 | Discharge: 2012-01-16 | Disposition: A | Discharge status: Home / Self Care | Payer: Medicare Other | Source: Ambulatory Visit | Attending: Vascular Surgery | Admitting: Vascular Surgery

## 2012-01-16 ENCOUNTER — Encounter: Payer: Self-pay | Admitting: Vascular Surgery

## 2012-01-16 ENCOUNTER — Ambulatory Visit (INDEPENDENT_AMBULATORY_CARE_PROVIDER_SITE_OTHER): Payer: Medicare Other | Admitting: Vascular Surgery

## 2012-01-16 VITALS — BP 116/59 | HR 72 | Ht 69.0 in | Wt 177.8 lb

## 2012-01-16 DIAGNOSIS — I714 Abdominal aortic aneurysm, without rupture: Secondary | ICD-10-CM

## 2012-01-16 DIAGNOSIS — Z48812 Encounter for surgical aftercare following surgery on the circulatory system: Secondary | ICD-10-CM

## 2012-01-16 DIAGNOSIS — Z8679 Personal history of other diseases of the circulatory system: Secondary | ICD-10-CM

## 2012-01-16 DIAGNOSIS — Z95828 Presence of other vascular implants and grafts: Secondary | ICD-10-CM

## 2012-01-16 MED ORDER — IOHEXOL 350 MG/ML SOLN: 100.0000 mL | Freq: Once | INTRAVENOUS | Status: AC | PRN

## 2012-01-16 NOTE — Progress Notes (Signed)
[facility] [place]  Established EVAR Date of Surgery: 12/2010 Left brachial artery exposure, cannulation and repair  2. Aortogram  3. Left arm angiogram  3. Placement of aortic conversion graft (Aortounibody) (see Dr. Evelina Dun note)  4. Placement of aortic proximal extension cuff (see Dr. Evelina Dun note)  Surgeon: Fabienne Bruns, MD  History of Present Illness  [name] [name]. is a 78 y.o. male who presents for routine follow up s/p EVAR. Most recent EVAR CTA on (Date: 01/16/2012) demonstrates: unchanged sac size.   The patient denies back; denies abdominal pain; complains of lower extremity pain/achiness after walking 15 minutes but denies rest or night pain. Pt states he has had ABI's in the past. This is not limiting his lifestyle. He also has some mild varicosities and pitting edema in both legs  Past Medical History, Past Surgical History, Social History, Family History, Medications, Allergies, and Review of Systems were reviewed and are unchanged from previous evaluation.  Physical Examination  Filed Vitals:   01/16/12 1310  BP: 116/59  Pulse: 72  Height: 5\' 9"  (1.753 m)  Weight: 177 lb 12.8 oz (80.65 kg)  SpO2: 98%    General: A&O x 3, WDWN male in NAD Gait: Normal HENT: WNL Eyes: Pupils equal Pulmonary: normal non-labored breathing , without Rales, rhonchi,  wheezing Cardiac: RRR, without  Murmurs, rubs or gallops; No carotid bruits Abdomen: soft, NT, no masses Skin: no rashes, ulcers noted Vascular Exam/Pulses: LOWER EXTREMITY PULSES           RIGHT                                      LEFT      POSTERIOR TIBIAL doppler - biphasic doppler - biphasic       DORSALIS PEDIS      ANTERIOR TIBIAL absent absent   Extremities without ischemic changes, no Gangrene , no cellulitis; no open wounds;  Musculoskeletal: no muscle wasting or atrophy  Neurologic: A&O X 3; Appropriate Affect ; SENSATION: normal; MOTOR FUNCTION:  moving all extremities  equally. Speech is fluent/normal  Non-Invasive Vascular Imaging EVAR Duplex (Date: )  AAA sac size: 11 cm  Type no endoleak detected  Medical Decision Making  [name]. is a 78 y.o. male who presents s/p EVAR.  Pt is Asymptomatic with unchanged sac size.  The next CT will be scheduled for 12 months.  The patient will follow up with [place] in 12 months with these studies.  Thank you for allowing [place] to participate in this patient's care. BLE Claudication vs venous stasis/reflux. This is not limiting his lifestyle. He also has some mild varicosities and pitting edema in both legs.  Recommend support hose and ambulating up to 30 minutes per day If symptoms worsen then would consider ABI's   Addendum: History and exam details as above. He has no pulsatile abdominal mass although the aneurysm is palpable. CT angiogram of the abdomen and pelvis was reviewed today. Radiologic measurements were approximately 4 mm increased from previous scan. Aneurysm diameter was 11 cm. This is most likely with an air her measurement. No evidence of endoleak. No evidence of migration. Patient will have a followup CT scan in one year. He will continue to wear lower from a compression stockings to relieve symptoms in his lower extremities. We will consider ABIs if he thinks his leg problems are worse.  Fabienne Bruns,  MD [facility] [place] Office: [phone] Pager: [phone]

## 2012-01-16 NOTE — Patient Instructions (Signed)
Ambulate 30 minute per day  Wear support hose to both legs during the day as tolerated

## 2012-01-17 NOTE — Addendum Note (Signed)
Addended by: Sharee Pimple on: 01/17/2012 08:24 AM   Modules accepted: Orders

## 2012-03-02 ENCOUNTER — Encounter: Payer: Medicare Other | Admitting: *Deleted

## 2012-03-06 ENCOUNTER — Encounter: Payer: Self-pay | Admitting: *Deleted

## 2012-03-14 ENCOUNTER — Encounter: Payer: Self-pay | Admitting: Internal Medicine

## 2012-03-16 ENCOUNTER — Ambulatory Visit (INDEPENDENT_AMBULATORY_CARE_PROVIDER_SITE_OTHER): Payer: Medicare Other | Admitting: *Deleted

## 2012-03-16 DIAGNOSIS — I495 Sick sinus syndrome: Secondary | ICD-10-CM

## 2012-03-16 DIAGNOSIS — Z950 Presence of cardiac pacemaker: Secondary | ICD-10-CM

## 2012-03-18 LAB — REMOTE PACEMAKER DEVICE
AL AMPLITUDE: 1.5 mv
BAMS-0001: 170 {beats}/min
BATTERY VOLTAGE: 2.96 V
RV LEAD AMPLITUDE: 5.9 mv

## 2012-03-31 ENCOUNTER — Encounter: Payer: Self-pay | Admitting: *Deleted

## 2012-05-06 ENCOUNTER — Encounter: Payer: Self-pay | Admitting: Cardiology

## 2012-06-01 ENCOUNTER — Encounter: Payer: Self-pay | Admitting: *Deleted

## 2012-06-15 ENCOUNTER — Encounter: Payer: Self-pay | Admitting: Internal Medicine

## 2012-06-15 ENCOUNTER — Ambulatory Visit (INDEPENDENT_AMBULATORY_CARE_PROVIDER_SITE_OTHER): Payer: Medicare Other | Admitting: *Deleted

## 2012-06-15 DIAGNOSIS — Z950 Presence of cardiac pacemaker: Secondary | ICD-10-CM

## 2012-06-15 DIAGNOSIS — I495 Sick sinus syndrome: Secondary | ICD-10-CM

## 2012-06-29 LAB — REMOTE PACEMAKER DEVICE
AL AMPLITUDE: 1.2 mv
AL IMPEDENCE PM: 472 Ohm
ATRIAL PACING PM: 91.43
BAMS-0001: 170 {beats}/min
RV LEAD IMPEDENCE PM: 608 Ohm
VENTRICULAR PACING PM: 7.33

## 2012-07-01 ENCOUNTER — Encounter: Payer: Self-pay | Admitting: *Deleted

## 2012-07-16 ENCOUNTER — Ambulatory Visit: Payer: Medicare Other | Admitting: Vascular Surgery

## 2012-08-06 IMAGING — CT CT CTA ABD/PEL W/CM AND/OR W/O CM
2 of 9 series · 12 of 36 positions shown, 17 images · IV contrast (agent unspecified)
Comparison: 09/08/2009

CLINICAL DATA: Aortic aneurysm, post stent graft.

CT ANGIOGRAPHY ABDOMEN AND PELVIS
TECHNIQUE: Multidetector CT imaging of the abdomen and pelvis
using the standard protocol during bolus administration of
intravenous contrast. Multiplanar reconstructed images obtained and
reviewed to evaluate the vascular anatomy.
Contrast: 100 ml 8mnipaque-TTT IV

[Series 100: angio · axial · 0.82mm/px · z∈[-394,-92]mm · 6 of 171 slices shown, 11 images]
[im 25/171  soft-tissue]
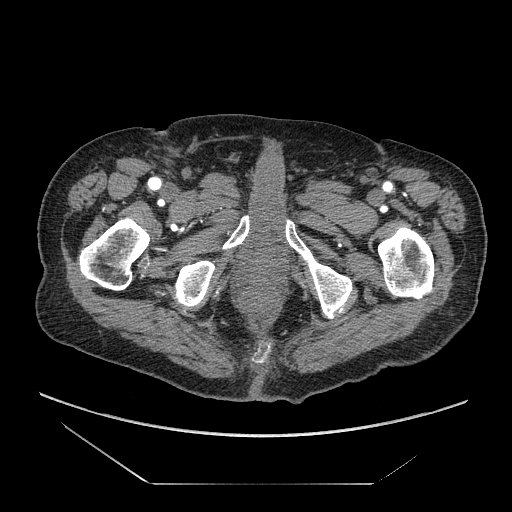
[im 25/171  bone]
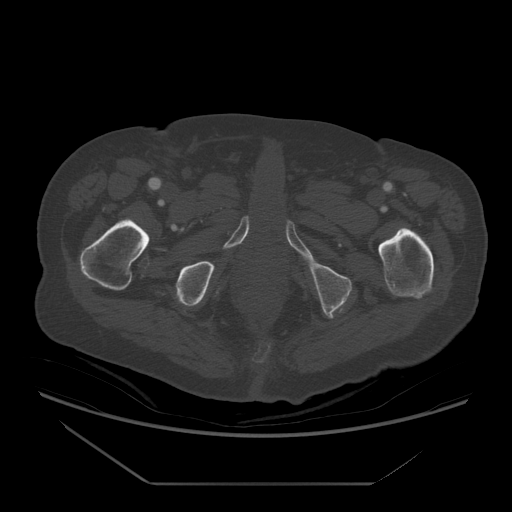
[im 49/171  soft-tissue]
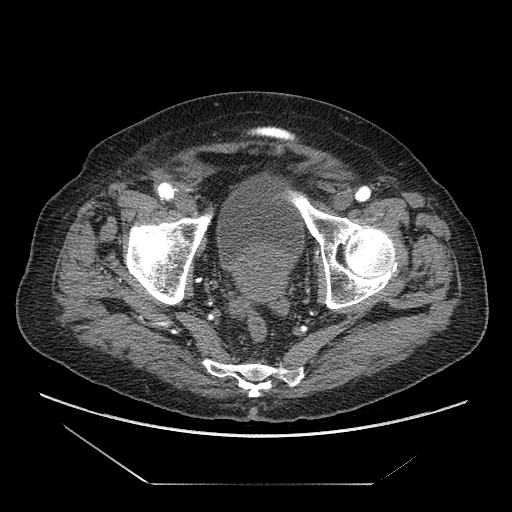
[im 73/171  soft-tissue]
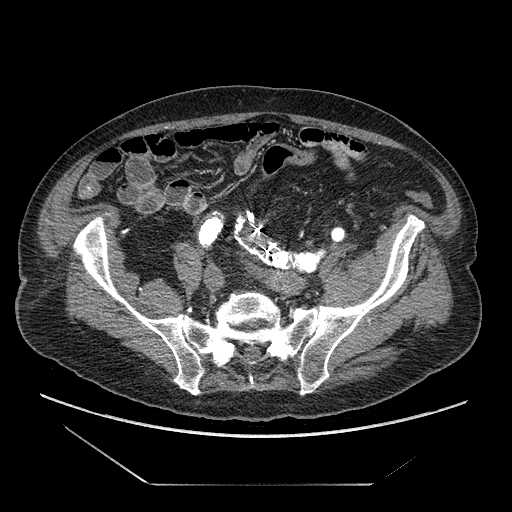
[im 73/171  lung]
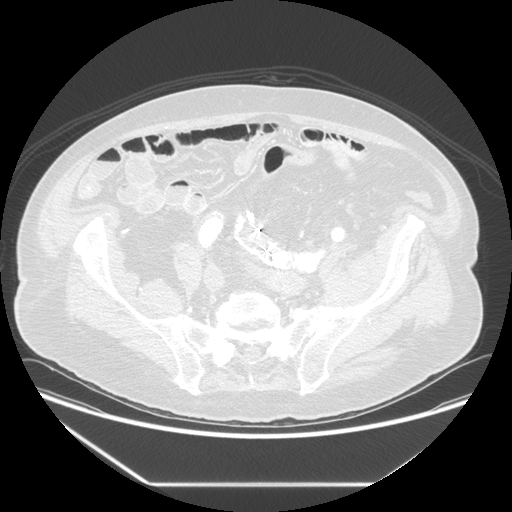
[im 98/171  soft-tissue]
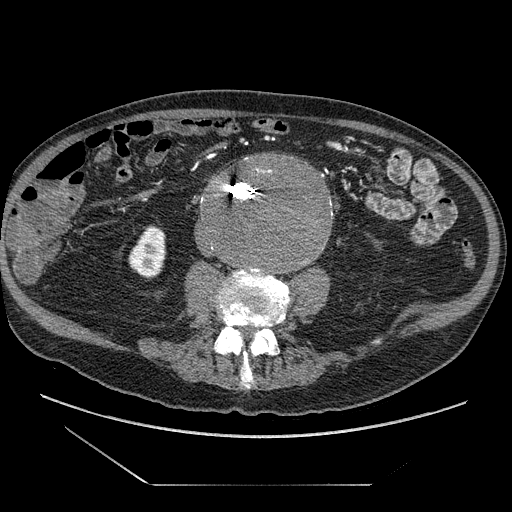
[im 98/171  lung]
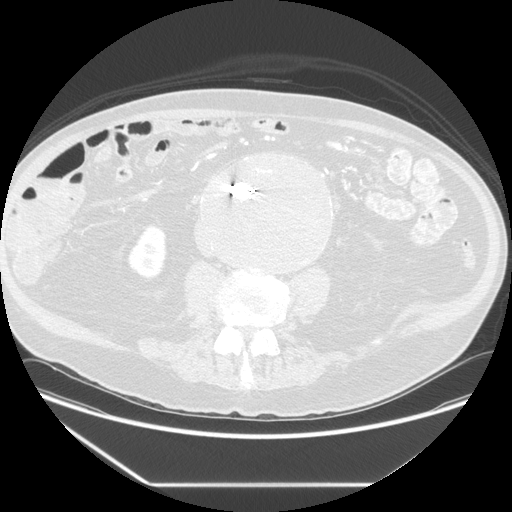
[im 122/171  soft-tissue]
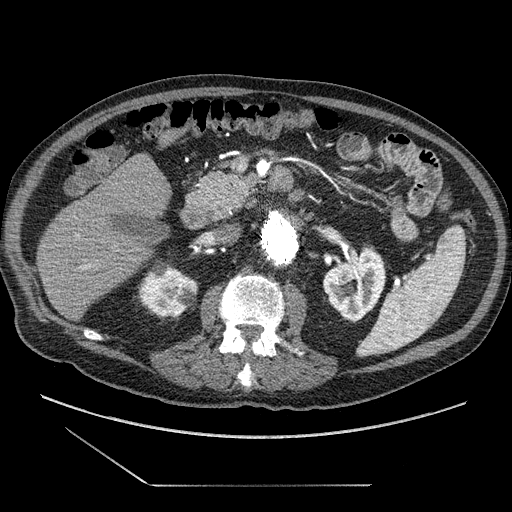
[im 122/171  lung]
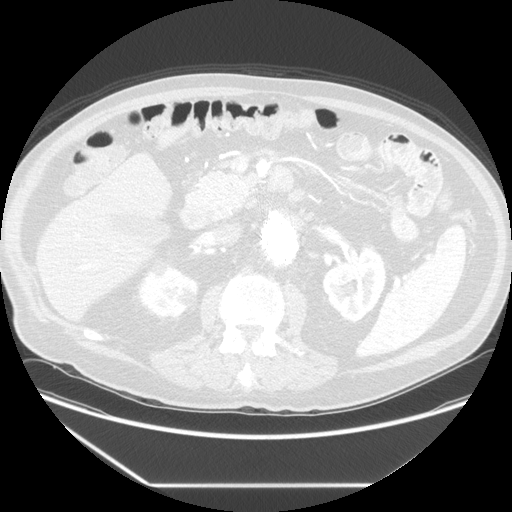
[im 146/171  soft-tissue]
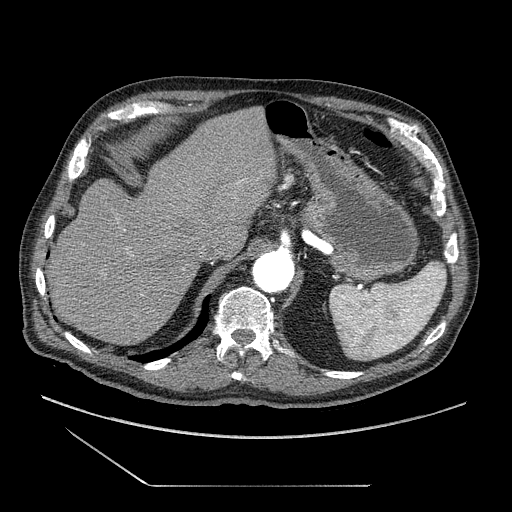
[im 146/171  lung]
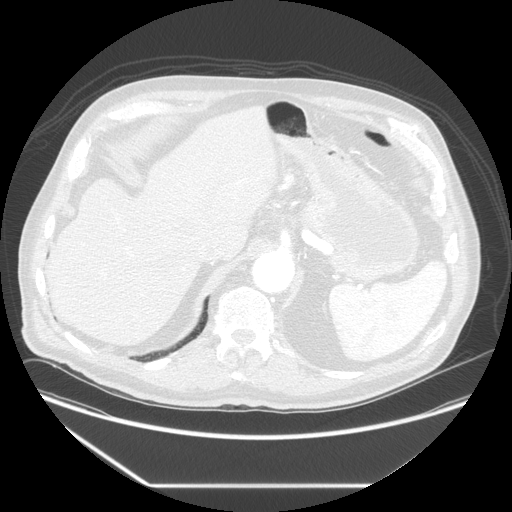

[Series 602: sagittal body · sagittal · 0.83mm/px · 6 of 169 slices shown]
[im 25/169  soft-tissue]
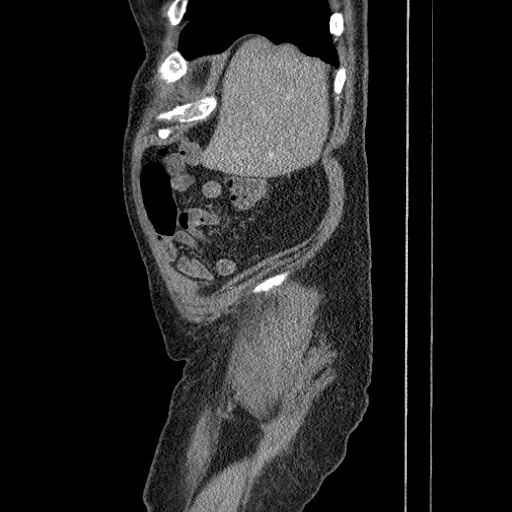
[im 49/169  soft-tissue]
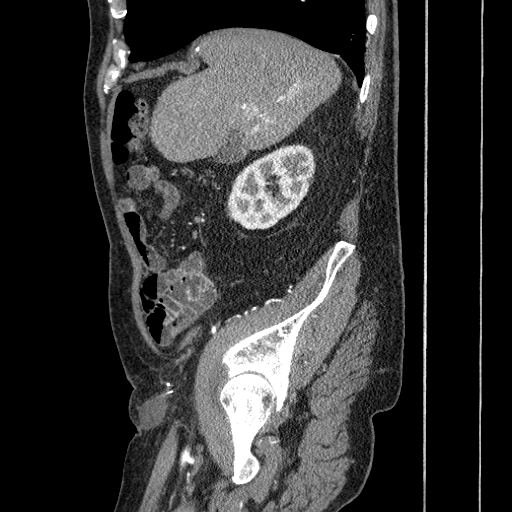
[im 73/169  soft-tissue]
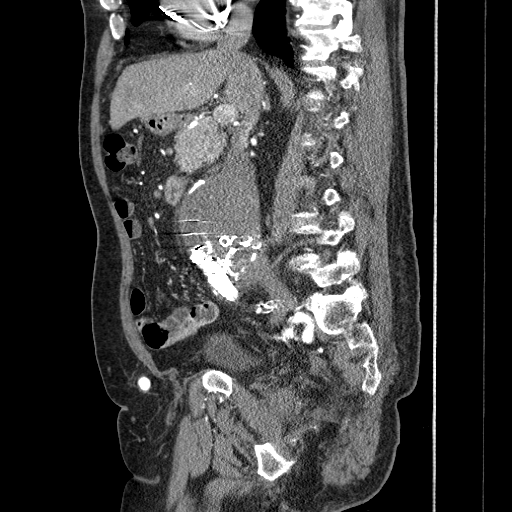
[im 97/169  soft-tissue]
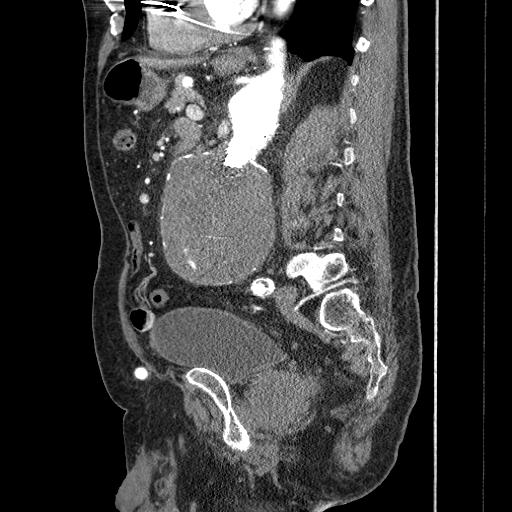
[im 121/169  soft-tissue]
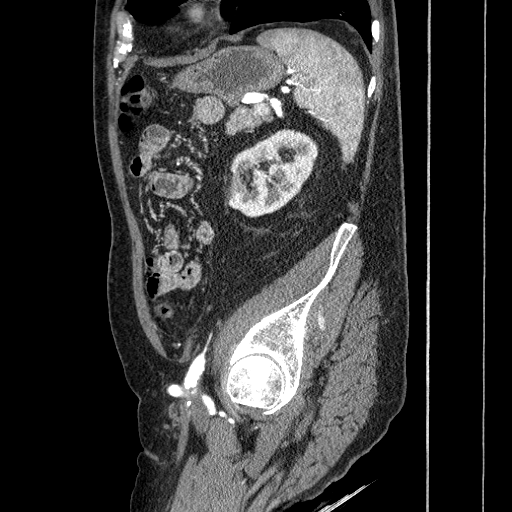
[im 145/169  soft-tissue]
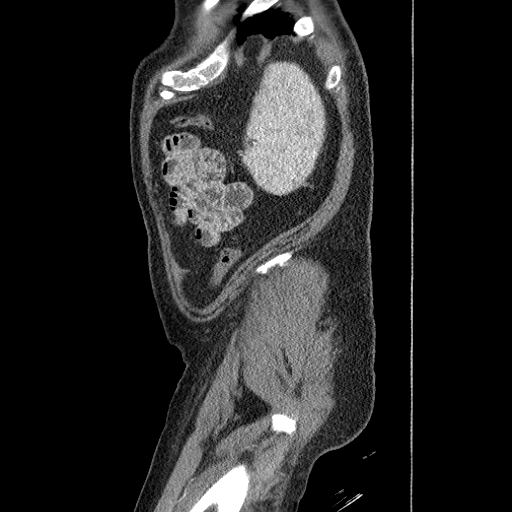

[12 of 36 positions shown; findings below may reference images not displayed]

FINDINGS: Abdominal aorta: Stable suprarenal aortic stent graft, post
revision with occlusion of the original left limb. Coil material is
evident in the right lateral aspect of the native aneurysm sac.
There is persistent endoleak in the anterior aneurysm sac near the
origin of the inferior mesenteric artery, and posteriorly  just
above the bifurcation probably related to persistent   lumbar
arteries.  Maximum native sac diameter 10.9 cm, stable.

Celiac axis:  Scattered eccentric calcified plaque, most notable in
the splenic artery, without significant stenosis or aneurysm

Superior mesenteric artery:  Origin covered by stent tines, but
patent, with some minimal eccentric nonocclusive calcified plaque
distally.  Replaced right hepatic arterial supply, an anatomic
variant.

Left renal:  Covered by stent tines, but patent.

Right renal artery:  Single, covered by the stent tines but patent.
9 mm renal artery aneurysm bifurcation of the main renal artery,
stable.

Inferior mesenteric artery:  Patent, with retrograde flow into the
native aneurysm sac on delayed scans.

Right iliac arterial system:  Distal limb of the stent graft is
patent, extends to the proximal external iliac artery. Coils in the
proximal internal iliac artery.  There is collateral reconstitution
of the distal internal iliac artery.  The distal external iliac
artery, common femoral artery, and visualized portions of proximal
SFA widely patent.  There is a patent fem-fem bypass graft.

Left iliac arterial system:  The left limb of the stent graft is
occluded with coils as before.  No associated endoleak.   Bypass
graft to the common femoral artery is widely patent, with moderate
stenosis in the more proximal common femoral artery and retrograde
flow into  patent external and internal iliac arteries.  Visualized
portions of proximal SFA widely patent.

Venous phase:  Patent hepatic veins, portal vein, superior
mesenteric vein, splenic vein, bilateral renal veins, IVC, and
iliac venous system.

Nonvascular findings:  Linear dependent atelectasis or subpleural
scarring posteriorly in the visualized left lower lobe.
Transvenous pacing leads are partially visualized.  Unremarkable
liver, gallbladder, spleen, adrenal glands.  Focal areas of
parenchymal loss in the lower pole left kidney as before.  Stable
small bilateral renal cysts.  No hydronephrosis.  Stomach and small
bowel are nondistended.  Normal appendix.  The colon is nondilated,
unremarkable.  Urinary bladder physiologically distended.  There is
moderate prostatic enlargement.  Bilateral pelvic phleboliths.
Spondylitic changes in the lower lumbar spine.
IMPRESSION: 1.  Patent covered stent graft from the suprarenal abdominal aorta
to the proximal right external iliac artery.
2.  Persistent type 2-B endoleak, probably involving patent
inferior mesenteric and lumbar arteries.  The 10.9 cm native
aneurysm sac is stable in maximal diameter.
3. Patent fem-fem bypass graft.
4.  Stable 9mm distal right renal artery aneurysm.
5.  Prostatic enlargement.

## 2012-09-21 ENCOUNTER — Encounter: Payer: Medicare Other | Admitting: *Deleted

## 2012-09-25 ENCOUNTER — Encounter: Payer: Self-pay | Admitting: *Deleted

## 2012-10-02 ENCOUNTER — Ambulatory Visit (INDEPENDENT_AMBULATORY_CARE_PROVIDER_SITE_OTHER): Payer: Medicare Other | Admitting: *Deleted

## 2012-10-02 DIAGNOSIS — I495 Sick sinus syndrome: Secondary | ICD-10-CM

## 2012-10-02 DIAGNOSIS — Z950 Presence of cardiac pacemaker: Secondary | ICD-10-CM

## 2012-10-06 ENCOUNTER — Telehealth: Payer: Self-pay | Admitting: *Deleted

## 2012-10-06 NOTE — Telephone Encounter (Signed)
LMOM for pt to return call in regards to remote check on ppm. PPM reached ERI on 07-05-12 after last remote check on 06-15-12 battery voltage was 2.97 V. Pt needs to reschedule appointment with JA from 10-15 to sooner.

## 2012-10-08 NOTE — Telephone Encounter (Signed)
Spoke w/pt and rescheduled pt for 10-21-12 @ 1345 with JA for gen change to be set up. Pt aware of appointment time.

## 2012-10-08 NOTE — Telephone Encounter (Signed)
LMOM at home phone/kwm LMOVM on cell # [id number]

## 2012-10-16 LAB — REMOTE PACEMAKER DEVICE
AL IMPEDENCE PM: 400 Ohm
BATTERY VOLTAGE: 2.96 V
VENTRICULAR PACING PM: 62.99

## 2012-10-21 ENCOUNTER — Ambulatory Visit (INDEPENDENT_AMBULATORY_CARE_PROVIDER_SITE_OTHER): Payer: Medicare Other | Admitting: Internal Medicine

## 2012-10-21 ENCOUNTER — Encounter: Payer: Self-pay | Admitting: Internal Medicine

## 2012-10-21 ENCOUNTER — Encounter: Payer: Self-pay | Admitting: *Deleted

## 2012-10-21 VITALS — BP 116/62 | HR 68 | Ht 69.0 in | Wt 175.0 lb

## 2012-10-21 DIAGNOSIS — Z950 Presence of cardiac pacemaker: Secondary | ICD-10-CM

## 2012-10-21 DIAGNOSIS — I495 Sick sinus syndrome: Secondary | ICD-10-CM

## 2012-10-21 DIAGNOSIS — Z45018 Encounter for adjustment and management of other part of cardiac pacemaker: Secondary | ICD-10-CM

## 2012-10-21 DIAGNOSIS — I4891 Unspecified atrial fibrillation: Secondary | ICD-10-CM

## 2012-10-21 LAB — CBC WITH DIFFERENTIAL/PLATELET
Basophils Relative: 0.4 % (ref 0.0–3.0)
Eosinophils Absolute: 0.1 10*3/uL (ref 0.0–0.7)
Eosinophils Relative: 0.9 % (ref 0.0–5.0)
Hemoglobin: 12.4 g/dL — ABNORMAL LOW (ref 13.0–17.0)
Lymphocytes Relative: 24.8 % (ref 12.0–46.0)
MCHC: 33.7 g/dL (ref 30.0–36.0)
Monocytes Relative: 5.8 % (ref 3.0–12.0)
Neutrophils Relative %: 68.1 % (ref 43.0–77.0)
RBC: 3.76 Mil/uL — ABNORMAL LOW (ref 4.22–5.81)
WBC: 5.8 10*3/uL (ref 4.5–10.5)

## 2012-10-21 LAB — PACEMAKER DEVICE OBSERVATION

## 2012-10-21 LAB — BASIC METABOLIC PANEL
Calcium: 9.1 mg/dL (ref 8.4–10.5)
Creatinine, Ser: 1.3 mg/dL (ref 0.4–1.5)
GFR: 53.07 mL/min — ABNORMAL LOW (ref >60.00)
Sodium: 136 mEq/L (ref 135–145)

## 2012-10-21 LAB — PROTIME-INR: INR: 3.1 ratio — ABNORMAL HIGH (ref 0.8–1.0)

## 2012-10-21 NOTE — Patient Instructions (Addendum)
Device generator change Fri  See instruction sheet

## 2012-10-21 NOTE — Progress Notes (Signed)
PCP: PATERSON,DANIEL G, MD Primary Cardiologist:  Dr [name] is a 79 y.o. male who presents today for routine electrophysiology followup.  Recent remote check revealed ERI battery status since 5/14.  The patient has noticed that since that time, his HR has been fixed at 65 bpm.  WIth this, he has had progressive symptoms of SOB and fatigue.  His exercise tolerance has decreased.  Today, he denies symptoms of palpitations, chest pain, presyncope, or syncope.  He has stable mild edema.  The patient is otherwise without complaint today.   Past Medical History  Diagnosis Date  . Atrial fibrillation     paroxysmal  . Atrial flutter   . Unspecified essential hypertension   . Anal fissure   . Melanoma   . Hyperlipidemia   . PVD (peripheral vascular disease)     s/p fem-fem bypass grafting 2006  . AAA (abdominal aortic aneurysm)     s/p graft repair 2000  . SSS (sick sinus syndrome)     s/p Medtronic PPM implantation 07/2008  . Arthritis   . Mild claudication    Past Surgical History  Procedure Laterality Date  . Melanoma excision      4 times  . Cataract extraction    . Hernia repair    . Pacemaker insertion    . Tonsillectomy    . Abdominal aortic aneurysm repair    . Aortic stent graft    . Us echocardiography  07/15/2008    EF 55-60%  . Endovascular stent insertion  01/09/2011    Procedure: ENDOVASCULAR STENT GRAFT INSERTION;  Surgeon: Charles E Fields, MD;  Location: [facility] OR;  Service: Vascular;  Laterality: Right;  With Left  Brachial artery cut-down    Current Outpatient Prescriptions  Medication Sig Dispense Refill  . allopurinol (ZYLOPRIM) 300 MG tablet Take 300 mg by mouth daily.        . CALCIUM CARBONATE PO Take by mouth. Taking 1.5 Grams Daily      . digoxin (LANOXIN) 0.125 MG tablet Take 125 mcg by mouth daily.        . ergocalciferol (VITAMIN D2) 50000 UNITS capsule Take 50,000 Units by mouth every 30 (thirty) days. Take on the first on every month.       . finasteride (PROSCAR) 5 MG tablet Take 5 mg by mouth daily.        . fosinopril (MONOPRIL) 40 MG tablet Take 20 mg by mouth daily. 1/2 tablet once daily      . metoprolol (LOPRESSOR) 50 MG tablet Take 25 mg by mouth 2 (two) times daily. 1/2 po bid      . simvastatin (ZOCOR) 20 MG tablet Take 20 mg by mouth at bedtime.        . warfarin (COUMADIN) 2.5 MG tablet Take 2.5-5 mg by mouth daily. Take 2.5mg on Tues and Thurs, and takes 5mg on Sun, Mon, Wed, Fri, and Sat.       No current facility-administered medications for this visit.    Physical Exam: Filed Vitals:   10/21/12 1406  BP: 116/62  Pulse: 68  Height: 5' 9" (1.753 m)  Weight: 175 lb (79.379 kg)    GEN- The patient is well appearing, alert and oriented x 3 today.   Head- normocephalic, atraumatic Eyes-  Sclera clear, conjunctiva pink Ears- hearing intact Oropharynx- clear Lungs- Clear to ausculation bilaterally, normal work of breathing Chest- pacemaker pocket is well healed Heart- irregular rate and rhythm, no murmurs, rubs or   gallops, PMI not laterally displaced GI- soft, NT, ND, + BS Extremities- no clubbing, cyanosis, or edema  Pacemaker interrogation- reviewed in detail today,  See PACEART report  Assessment and Plan:  1. Bradycardia He has reached EOL battery status.  He is not dependant.  Today I had a long discussion with the patient and his family about the difficulty with the EnRhythm battery and its implications.  MDT has advised generator change due to unreliable battery duration going forward.  I have reprogrammed DDD today to promote AV conduction and hopefully his recent decrease in exercise tolerance will improve. Risks, benefits, and alternatives to PPM generator change were discussed with the patient and his family who wish to proceed.  We will schedule the procedure at the next available time.  2. Afib/ atrial flutter Rate controlled Continue coumadin long term    

## 2012-10-22 MED ORDER — CEFAZOLIN SODIUM-DEXTROSE 2-3 GM-% IV SOLR
2.0000 g | INTRAVENOUS | Status: AC
  Filled 2012-10-22: qty 50

## 2012-10-22 MED ORDER — SODIUM CHLORIDE 0.9 % IR SOLN
80.0000 mg | Status: AC
  Filled 2012-10-22: qty 2

## 2012-10-23 ENCOUNTER — Ambulatory Visit (HOSPITAL_COMMUNITY)
Admission: RE | Admit: 2012-10-23 | Discharge: 2012-10-23 | Disposition: A | Discharge status: Home / Self Care | Payer: Medicare Other | Source: Ambulatory Visit | Attending: Internal Medicine | Admitting: Internal Medicine

## 2012-10-23 ENCOUNTER — Encounter (HOSPITAL_COMMUNITY)
Admission: RE | Disposition: A | Discharge status: Home / Self Care | Payer: Self-pay | Source: Ambulatory Visit | Attending: Internal Medicine

## 2012-10-23 DIAGNOSIS — I10 Essential (primary) hypertension: Secondary | ICD-10-CM | POA: Insufficient documentation

## 2012-10-23 DIAGNOSIS — Z85820 Personal history of malignant melanoma of skin: Secondary | ICD-10-CM | POA: Insufficient documentation

## 2012-10-23 DIAGNOSIS — Z79899 Other long term (current) drug therapy: Secondary | ICD-10-CM | POA: Insufficient documentation

## 2012-10-23 DIAGNOSIS — I495 Sick sinus syndrome: Secondary | ICD-10-CM

## 2012-10-23 DIAGNOSIS — Z7901 Long term (current) use of anticoagulants: Secondary | ICD-10-CM | POA: Insufficient documentation

## 2012-10-23 DIAGNOSIS — I739 Peripheral vascular disease, unspecified: Secondary | ICD-10-CM | POA: Insufficient documentation

## 2012-10-23 DIAGNOSIS — E785 Hyperlipidemia, unspecified: Secondary | ICD-10-CM | POA: Insufficient documentation

## 2012-10-23 DIAGNOSIS — I4891 Unspecified atrial fibrillation: Secondary | ICD-10-CM | POA: Insufficient documentation

## 2012-10-23 DIAGNOSIS — M129 Arthropathy, unspecified: Secondary | ICD-10-CM | POA: Insufficient documentation

## 2012-10-23 DIAGNOSIS — Z45018 Encounter for adjustment and management of other part of cardiac pacemaker: Secondary | ICD-10-CM

## 2012-10-23 HISTORY — PX: PACEMAKER GENERATOR CHANGE: SHX5998

## 2012-10-23 HISTORY — PX: PERMANENT PACEMAKER GENERATOR CHANGE: SHX6022

## 2012-10-23 LAB — PROTIME-INR: INR: 2.68 — ABNORMAL HIGH (ref 0.00–1.49)

## 2012-10-23 SURGERY — PERMANENT PACEMAKER GENERATOR CHANGE
Anesthesia: LOCAL

## 2012-10-23 MED ORDER — MUPIROCIN 2 % EX OINT
TOPICAL_OINTMENT | Freq: Two times a day (BID) | CUTANEOUS | Status: DC
  Administered 2012-10-23: 10:00:00 via NASAL
  Filled 2012-10-23: qty 22

## 2012-10-23 MED ORDER — ACETAMINOPHEN 325 MG PO TABS: 325.0000 mg | ORAL_TABLET | ORAL | Status: DC | PRN

## 2012-10-23 MED ORDER — LIDOCAINE HCL (PF) 1 % IJ SOLN
INTRAMUSCULAR | Status: AC
  Filled 2012-10-23: qty 60

## 2012-10-23 MED ORDER — ONDANSETRON HCL 4 MG/2ML IJ SOLN: 4.0000 mg | Freq: Four times a day (QID) | INTRAMUSCULAR | Status: DC | PRN

## 2012-10-23 MED ORDER — SODIUM CHLORIDE 0.9 % IV SOLN
INTRAVENOUS | Status: DC
  Administered 2012-10-23: 10:00:00 via INTRAVENOUS

## 2012-10-23 MED ORDER — SODIUM CHLORIDE 0.9 % IV SOLN: 250.0000 mL | INTRAVENOUS | Status: DC | PRN

## 2012-10-23 MED ORDER — SODIUM CHLORIDE 0.9 % IJ SOLN: 3.0000 mL | INTRAMUSCULAR | Status: DC | PRN

## 2012-10-23 MED ORDER — SODIUM CHLORIDE 0.9 % IJ SOLN: 3.0000 mL | Freq: Two times a day (BID) | INTRAMUSCULAR | Status: DC

## 2012-10-23 MED ORDER — HYDROCODONE-ACETAMINOPHEN 5-325 MG PO TABS: 1.0000 | ORAL_TABLET | ORAL | Status: DC | PRN

## 2012-10-23 MED ORDER — CHLORHEXIDINE GLUCONATE 4 % EX LIQD
60.0000 mL | Freq: Once | CUTANEOUS | Status: DC
  Filled 2012-10-23: qty 60

## 2012-10-23 NOTE — Interval H&P Note (Signed)
History and Physical Interval Note:  10/23/2012 10:57 AM  [name] [name].  has presented today for surgery, with the diagnosis of ERI  The various methods of treatment have been discussed with the patient and family. After consideration of risks, benefits and other options for treatment, the patient has consented to  Procedure(s): PERMANENT PACEMAKER GENERATOR CHANGE (N/A) as a surgical intervention .  The patient's history has been reviewed, patient examined, no change in status, stable for surgery.  I have reviewed the patient's chart and labs.  Questions were answered to the patient's satisfaction.     Hillis Range

## 2012-10-23 NOTE — H&P (View-Only) (Signed)
PCP: Garlan Fillers, MD Primary Cardiologist:  Dr [place]  [name] [name]. is a 79 y.o. male who presents today for routine electrophysiology followup.  Recent remote check revealed ERI battery status since 5/14.  The patient has noticed that since that time, his HR has been fixed at 65 bpm.  WIth this, he has had progressive symptoms of SOB and fatigue.  His exercise tolerance has decreased.  Today, he denies symptoms of palpitations, chest pain, presyncope, or syncope.  He has stable mild edema.  The patient is otherwise without complaint today.   Past Medical History  Diagnosis Date  . Atrial fibrillation     paroxysmal  . Atrial flutter   . Unspecified essential hypertension   . Anal fissure   . Melanoma   . Hyperlipidemia   . PVD (peripheral vascular disease)     s/p fem-fem bypass grafting 2006  . AAA (abdominal aortic aneurysm)     s/p graft repair 2000  . SSS (sick sinus syndrome)     s/p Medtronic PPM implantation 07/2008  . Arthritis   . Mild claudication    Past Surgical History  Procedure Laterality Date  . Melanoma excision      4 times  . Cataract extraction    . Hernia repair    . Pacemaker insertion    . Tonsillectomy    . Abdominal aortic aneurysm repair    . Aortic stent graft    . US echocardiography  07/15/2008    EF 55-60%  . Endovascular stent insertion  01/09/2011    Procedure: ENDOVASCULAR STENT GRAFT INSERTION;  Surgeon: Sherren Kerns, MD;  Location: [facility] OR;  Service: Vascular;  Laterality: Right;  With Left  Brachial artery cut-down    Current Outpatient Prescriptions  Medication Sig Dispense Refill  . allopurinol (ZYLOPRIM) 300 MG tablet Take 300 mg by mouth daily.        Marland Kitchen CALCIUM CARBONATE PO Take by mouth. Taking 1.5 Grams Daily      . digoxin (LANOXIN) 0.125 MG tablet Take 125 mcg by mouth daily.        . ergocalciferol (VITAMIN D2) 50000 UNITS capsule Take 50,000 Units by mouth every 30 (thirty) days. Take on the first on every month.       . finasteride (PROSCAR) 5 MG tablet Take 5 mg by mouth daily.        . fosinopril (MONOPRIL) 40 MG tablet Take 20 mg by mouth daily. 1/2 tablet once daily      . metoprolol (LOPRESSOR) 50 MG tablet Take 25 mg by mouth 2 (two) times daily. 1/2 po bid      . simvastatin (ZOCOR) 20 MG tablet Take 20 mg by mouth at bedtime.        Marland Kitchen warfarin (COUMADIN) 2.5 MG tablet Take 2.5-5 mg by mouth daily. Take 2.5mg  on Tues and Thurs, and takes 5mg  on Sun, Mon, Wed, Fri, and Sat.       No current facility-administered medications for this visit.    Physical Exam: Filed Vitals:   10/21/12 1406  BP: 116/62  Pulse: 68  Height: 5\' 9"  (1.753 m)  Weight: 175 lb (79.379 kg)    GEN- The patient is well appearing, alert and oriented x 3 today.   Head- normocephalic, atraumatic Eyes-  Sclera clear, conjunctiva pink Ears- hearing intact Oropharynx- clear Lungs- Clear to ausculation bilaterally, normal work of breathing Chest- pacemaker pocket is well healed Heart- irregular rate and rhythm, no murmurs, rubs or  gallops, PMI not laterally displaced GI- soft, NT, ND, + BS Extremities- no clubbing, cyanosis, or edema  Pacemaker interrogation- reviewed in detail today,  See PACEART report  Assessment and Plan:  1. Bradycardia He has reached EOL battery status.  He is not dependant.  Today I had a long discussion with the patient and his family about the difficulty with the EnRhythm battery and its implications.  MDT has advised generator change due to unreliable battery duration going forward.  I have reprogrammed DDD today to promote AV conduction and hopefully his recent decrease in exercise tolerance will improve. Risks, benefits, and alternatives to PPM generator change were discussed with the patient and his family who wish to proceed.  We will schedule the procedure at the next available time.  2. Afib/ atrial flutter Rate controlled Continue coumadin long term

## 2012-10-23 NOTE — Op Note (Signed)
SURGEON:  Hillis Range, MD     PREPROCEDURE DIAGNOSES:   1. Sick sinus syndrome.   2. Paroxsymal Atrial fibrillation.     POSTPROCEDURE DIAGNOSES:   1. Sick sinus syndrome.   2. Paroxsymal Atrial fibrillation.     PROCEDURES:   1. Pacemaker pulse generator replacement.   2. Skin pocket revision.     INTRODUCTION:  [name] [name]. is a 79 y.o. male with a history of sick sinus syndrome. He has done well since his pacemaker was implanted.  He has recently reached ERI battery status.  He presents today for pacemaker pulse generator replacement.       DESCRIPTION OF THE PROCEDURE:  Informed written consent was obtained, and the patient was brought to the electrophysiology lab in the fasting state.  The patient's pacemaker was interrogated today and found to be at elective replacement indicator battery status.  The patient required no sedation for the procedure today.  The patient's left chest was prepped and draped in the usual sterile fashion by the EP lab staff.  The skin overlying the existing pacemaker was infiltrated with lidocaine for local analgesia.  A 4-cm incision was made over the pacemaker pocket.  Using a combination of sharp and blunt dissection, the pacemaker was exposed and removed from the body.  The device was disconnected from the leads. A single silk suture was identified and removed which had secured the device to the pectoralis fascia.  There was no foreign matter or debris within the pocket.  The atrial lead was confirmed to be a Medtronic model [id number] (serial number [id number]) lead implanted on 07/19/2008.  The right ventricular lead was confirmed to be a Medtronic model [id number] (serial number [id number]) lead implanted on the same date as the atrial lead (above).  Both leads were examined and their integrity was confirmed to be intact.  Atrial  Fibrillation waves measured 3 mV with impedance of 462 ohms.  Right ventricular lead R-waves measured 8 mV with impedance of 513 ohms and  a threshold of 0.4 V at 0.5 msec.  Both leads were connected to a Medtronic [place] model [id number] number [id number]) pacemaker.  The pocket was revised to accommodate this new device.  Electrocautery was required to assure hemostasis.  The pocket was irrigated with copious gentamicin solution. The pacemaker was then placed into the pocket.  The pocket was then closed in 2 layers with 2-0 Vicryl suture over the subcutaneous and subcuticular layers.  Steri-Strips and a sterile dressing were then applied.  There were no early apparent complications.     CONCLUSIONS:   1. Successful pacemaker pulse generator replacement for elective replacement indicator battery status and sick sinus syndrome  2. No early apparent complications.     Hillis Range, MD 10/23/2012 1:13 PM

## 2012-10-26 ENCOUNTER — Telehealth: Payer: Self-pay | Admitting: Internal Medicine

## 2012-10-26 NOTE — Telephone Encounter (Signed)
LMOM for return call//kwm  

## 2012-10-26 NOTE — Telephone Encounter (Signed)
Pt needed to reschedule device check from 11-05-12 to 11-02-12. Pt aware of new appointment time and date.

## 2012-10-26 NOTE — Telephone Encounter (Signed)
New problem   Pt need to speak to nurse concerning his appt for 11/05/12 he can't make it and need to know how long can he wait to come in afterwards. Please call pt.

## 2012-11-02 ENCOUNTER — Ambulatory Visit (INDEPENDENT_AMBULATORY_CARE_PROVIDER_SITE_OTHER): Payer: Medicare Other | Admitting: *Deleted

## 2012-11-02 DIAGNOSIS — I4891 Unspecified atrial fibrillation: Secondary | ICD-10-CM

## 2012-11-02 LAB — PACEMAKER DEVICE OBSERVATION
AL AMPLITUDE: 2.8 mv
AL IMPEDENCE PM: 457 Ohm
BATTERY VOLTAGE: 2.8 V
RV LEAD AMPLITUDE: 8 mv
RV LEAD IMPEDENCE PM: 566 Ohm
VENTRICULAR PACING PM: 73

## 2012-11-02 NOTE — Progress Notes (Signed)
Wound check-PPM in office. 

## 2012-11-05 ENCOUNTER — Ambulatory Visit: Payer: Medicare Other

## 2012-11-14 ENCOUNTER — Encounter: Payer: Self-pay | Admitting: Internal Medicine

## 2012-11-16 ENCOUNTER — Encounter: Payer: Self-pay | Admitting: Internal Medicine

## 2012-11-25 ENCOUNTER — Encounter: Payer: Medicare Other | Admitting: Internal Medicine

## 2012-12-11 ENCOUNTER — Other Ambulatory Visit (HOSPITAL_COMMUNITY): Payer: Self-pay | Admitting: Internal Medicine

## 2012-12-11 DIAGNOSIS — R1011 Right upper quadrant pain: Secondary | ICD-10-CM

## 2012-12-17 ENCOUNTER — Other Ambulatory Visit: Payer: Self-pay

## 2012-12-29 ENCOUNTER — Ambulatory Visit (HOSPITAL_COMMUNITY): Payer: Medicare Other

## 2013-01-14 ENCOUNTER — Encounter (HOSPITAL_COMMUNITY)
Admission: RE | Admit: 2013-01-14 | Discharge: 2013-01-14 | Disposition: A | Discharge status: Home / Self Care | Payer: Medicare Other | Source: Ambulatory Visit | Attending: Internal Medicine | Admitting: Internal Medicine

## 2013-01-14 DIAGNOSIS — R1011 Right upper quadrant pain: Secondary | ICD-10-CM | POA: Insufficient documentation

## 2013-01-14 MED ORDER — TECHNETIUM TC 99M MEBROFENIN IV KIT
5.1000 | PACK | Freq: Once | INTRAVENOUS | Status: AC | PRN
  Administered 2013-01-14: 5.1 via INTRAVENOUS

## 2013-01-25 ENCOUNTER — Other Ambulatory Visit: Payer: Self-pay | Admitting: Vascular Surgery

## 2013-01-25 LAB — CREATININE, SERUM: Creat: 1.23 mg/dL (ref 0.50–1.35)

## 2013-01-27 ENCOUNTER — Encounter: Payer: Self-pay | Admitting: Vascular Surgery

## 2013-01-28 ENCOUNTER — Ambulatory Visit
Admission: RE | Admit: 2013-01-28 | Discharge: 2013-01-28 | Disposition: A | Discharge status: Home / Self Care | Payer: Medicare Other | Source: Ambulatory Visit | Attending: Vascular Surgery | Admitting: Vascular Surgery

## 2013-01-28 ENCOUNTER — Other Ambulatory Visit: Payer: Self-pay

## 2013-01-28 ENCOUNTER — Ambulatory Visit (INDEPENDENT_AMBULATORY_CARE_PROVIDER_SITE_OTHER): Payer: Medicare Other | Admitting: Vascular Surgery

## 2013-01-28 ENCOUNTER — Encounter: Payer: Self-pay | Admitting: Vascular Surgery

## 2013-01-28 VITALS — BP 122/69 | HR 73 | Ht 69.0 in | Wt 168.5 lb

## 2013-01-28 DIAGNOSIS — I714 Abdominal aortic aneurysm, without rupture, unspecified: Secondary | ICD-10-CM

## 2013-01-28 DIAGNOSIS — Z48812 Encounter for surgical aftercare following surgery on the circulatory system: Secondary | ICD-10-CM

## 2013-01-28 MED ORDER — IOHEXOL 350 MG/ML SOLN
80.0000 mL | Freq: Once | INTRAVENOUS | Status: AC | PRN
  Administered 2013-01-28: 80 mL via INTRAVENOUS

## 2013-01-28 NOTE — Progress Notes (Signed)
Patient is a 79-year-old male whom I first met 2 years ago. He had initially treatment of in infrarenal abdominal aortic aneurysm with an Ancure graft in [facility] on  11/06/1998. He subsequently had treatment in [place] which with what sounds like coiling of what was described as a type 1 endoleak. I do not have any records of that procedure. He subsequently underwent an extensive operation which he reports took 9 hours at [facility] in August 2006. At that time he had his graft converted to an aortouniiliac and had a fem-fem bypass. Apparently had a type 1 endo leak from the right limb of his aorta femoral Endograft. Following that surgery in 2006 he had extensive treatment in [place] with wound problems from his fem-fem bypass, but subsequently healed these.  His CT scan in August 2008 showing maximal size of his aneurysm sac at 8.5 cm. she then presented with a ruptured 10 cm infrarenal aneurysm in 2012. This was repaired with a 36 x 116 Cook Renu graft and a 36 x 50 proximal cuff.   He was seen in December 2013 at which time the aneurysm was 11 cm in diameter but no obvious endoleak seen.  He returns today for further followup. He denies any abdominal or back pain. He states he recently had some work done on his pacemaker but feels overall well. He, otherwise, is very active and healthy, and is currently living in [place]. He is on Coumadin for atrial fibrillation.  Past Medical History  Diagnosis Date  . Atrial fibrillation     paroxysmal  . Atrial flutter   . Unspecified essential hypertension   . Anal fissure   . Melanoma   . Hyperlipidemia   . PVD (peripheral vascular disease)     s/p fem-fem bypass grafting 2006  . AAA (abdominal aortic aneurysm)     s/p graft repair 2000  . SSS (sick sinus syndrome)     s/p Medtronic PPM implantation 07/2008  . Arthritis   . Mild claudication    Past Surgical History  Procedure Laterality Date  . Melanoma excision      4 times  .  Cataract extraction    . Hernia repair    . Pacemaker insertion    . Tonsillectomy    . Abdominal aortic aneurysm repair    . Aortic stent graft    . Us echocardiography  07/15/2008    EF 55-60%  . Endovascular stent insertion  01/09/2011    Procedure: ENDOVASCULAR STENT GRAFT INSERTION;  Surgeon: Pradyun Ishman E Nessa Ramaker, MD;  Location: [facility] OR;  Service: Vascular;  Laterality: Right;  With Left  Brachial artery cut-down   Review of systems: He denies shortness of breath. He denies chest pain.  History   Social History  . Marital Status: Married    Spouse Name: N/A    Number of Children: 3  . Years of Education: N/A   Occupational History  . retired military    Social History Main Topics  . Smoking status: Former Smoker -- 32 years    Types: Cigarettes    Quit date: 02/12/1971  . Smokeless tobacco: Never Used  . Alcohol Use: 1.8 oz/week    2 Glasses of wine, 1 Cans of beer per week     Comment: occasionally  . Drug Use: No  . Sexual Activity: Not on file   Other Topics Concern  . Not on file   Social History Narrative  . No narrative on file   he   is a former prisoner of war during the [place] conflict.  Physical exam:  Filed Vitals:   01/28/13 1102  BP: 122/69  Pulse: 73  Height: 5' 9" (1.753 m)  Weight: 168 lb 8 oz (76.431 kg)  SpO2: 98%    Abdomen: Soft nontender nondistended no pulsatile mass 2+ femoral pulses bilaterally Chest: Clear to auscultation bilaterally Cardiac: Regular rate and rhythm  Data: CT angiogram of the abdomen and pelvis is reviewed today. This shows aneurysm is now 12.7 cm in diameter. Radiologic interpretation suggested a type II endoleak. However, I am unable to specifically identify this due to the large amount of calcium within the aneurysm sac. There is no question that the aneurysm has grown in diameter. The suprarenal aorta is dilated as well.  Assessment: Increase in diameter abdominal aortic aneurysm status post endovascular repair with  multiple revisions at risk for rupture. The patient is 79 years old open repair would essentially require thoracoabdominal aneurysm repair. He is probably not a very good candidate for this. He may also have limited endovascular options at this point. I believe the best option would be to repeat his aortogram to see if there is any percutaneous option and further define the endoleak. There is a possibility of a fenestrated graft perhaps. However further discussions with the patient after his arteriogram. This is scheduled for January 2. Risks benefits possible complications procedure details were trying to the patient today. I also discussed with him the high risk of rupture.  He also understands there are limited possibilities for further repair.  Plan: See above  Anzal Bartnick, MD [facility] Office: [phone] Pager: [phone]    

## 2013-01-29 ENCOUNTER — Encounter (HOSPITAL_COMMUNITY): Payer: Self-pay | Admitting: Pharmacy Technician

## 2013-02-02 ENCOUNTER — Encounter: Payer: Self-pay | Admitting: *Deleted

## 2013-02-10 ENCOUNTER — Encounter: Payer: Self-pay | Admitting: Internal Medicine

## 2013-02-10 ENCOUNTER — Ambulatory Visit (INDEPENDENT_AMBULATORY_CARE_PROVIDER_SITE_OTHER): Payer: Medicare Other | Admitting: Internal Medicine

## 2013-02-10 VITALS — BP 115/65 | HR 75 | Ht 69.0 in | Wt 169.8 lb

## 2013-02-10 DIAGNOSIS — Z950 Presence of cardiac pacemaker: Secondary | ICD-10-CM

## 2013-02-10 DIAGNOSIS — I4891 Unspecified atrial fibrillation: Secondary | ICD-10-CM

## 2013-02-10 DIAGNOSIS — Z9889 Other specified postprocedural states: Secondary | ICD-10-CM

## 2013-02-10 DIAGNOSIS — I495 Sick sinus syndrome: Secondary | ICD-10-CM

## 2013-02-10 DIAGNOSIS — Z8679 Personal history of other diseases of the circulatory system: Secondary | ICD-10-CM

## 2013-02-10 LAB — MDC_IDC_ENUM_SESS_TYPE_INCLINIC
Battery Remaining Longevity: 124 mo
Battery Voltage: 2.81 V
Brady Statistic AP VP Percent: 64 %
Brady Statistic AP VS Percent: 23 %
Brady Statistic AS VP Percent: 3 %
Date Time Interrogation Session: 20141231164008
Lead Channel Pacing Threshold Amplitude: 0.5 V
Lead Channel Pacing Threshold Amplitude: 0.5 V
Lead Channel Pacing Threshold Pulse Width: 0.4 ms
Lead Channel Sensing Intrinsic Amplitude: 2 mV
Lead Channel Sensing Intrinsic Amplitude: 8 mV
Lead Channel Setting Pacing Amplitude: 2.5 V

## 2013-02-10 NOTE — Progress Notes (Signed)
PCP: Garlan Fillers, MD Primary Cardiologist:  Dr [place]  [name] [name]. is a 79 y.o. male who presents today for routine electrophysiology followup.  Since her recent generator change, the patient reports doing very well.  Today, he denies symptoms of palpitations, chest pain, shortness of breath,  lower extremity edema, dizziness, presyncope, or syncope.  The patient is otherwise without complaint today.   Past Medical History  Diagnosis Date  . Atrial fibrillation     paroxysmal  . Atrial flutter   . Unspecified essential hypertension   . Anal fissure   . Melanoma   . Hyperlipidemia   . PVD (peripheral vascular disease)     s/p fem-fem bypass grafting 2006  . AAA (abdominal aortic aneurysm)     s/p graft repair 2000  . SSS (sick sinus syndrome)     s/p Medtronic PPM implantation 07/2008  . Arthritis   . Mild claudication    Past Surgical History  Procedure Laterality Date  . Melanoma excision      4 times  . Cataract extraction    . Hernia repair    . Pacemaker insertion  07/19/08    MDT Enrhythm, subsequently changed by Dr Johney Frame due to premature battery failure  . Tonsillectomy    . Abdominal aortic aneurysm repair    . Aortic stent graft    . US echocardiography  07/15/2008    EF 55-60%  . Endovascular stent insertion  01/09/2011    Procedure: ENDOVASCULAR STENT GRAFT INSERTION;  Surgeon: Sherren Kerns, MD;  Location: [facility] OR;  Service: Vascular;  Laterality: Right;  With Left  Brachial artery cut-down  . Pacemaker generator change  10/23/12    MDT Jana Half by Dr Johney Frame    Current Outpatient Prescriptions  Medication Sig Dispense Refill  . allopurinol (ZYLOPRIM) 300 MG tablet Take 300 mg by mouth daily.        Marland Kitchen CALCIUM CARBONATE PO Take 600 mg by mouth daily.       . digoxin (LANOXIN) 0.125 MG tablet Take 125 mcg by mouth daily.        . finasteride (PROSCAR) 5 MG tablet Take 5 mg by mouth daily.        . fosinopril (MONOPRIL) 40 MG tablet Take 20 mg by mouth  daily.       . metoprolol (LOPRESSOR) 50 MG tablet Take 25 mg by mouth 2 (two) times daily.       . simvastatin (ZOCOR) 20 MG tablet Take 20 mg by mouth at bedtime.        Marland Kitchen warfarin (COUMADIN) 2.5 MG tablet Take 2.5 mg by mouth daily.        No current facility-administered medications for this visit.    Physical Exam: Filed Vitals:   02/10/13 1419  BP: 115/65  Pulse: 75  Height: 5\' 9"  (1.753 m)  Weight: 169 lb 12.8 oz (77.021 kg)    GEN- The patient is well appearing, alert and oriented x 3 today.   Head- normocephalic, atraumatic Eyes-  Sclera clear, conjunctiva pink Ears- hearing intact Oropharynx- clear Lungs- Clear to ausculation bilaterally, normal work of breathing Chest- pacemaker pocket is well healed Heart- Regular rate and rhythm, no murmurs, rubs or gallops, PMI not laterally displaced GI- soft, NT, ND, + BS Extremities- no clubbing, cyanosis, +1 edema  Pacemaker interrogation- reviewed in detail today,  See PACEART report  Assessment and Plan:  1. Sick sinus syndrome Normal pacemaker function See Pace Art report No changes today  2. afib Well controlled Continue coumadin (CHADS2VASc is at least 3).    3. AAA He has possible surgical intervention planned by Dr Darrick Penna in the near future.  carelink Return to the device clinic 9/15.

## 2013-02-10 NOTE — Patient Instructions (Signed)
Your physician wants you to follow-up in: 10/2013 with Dr Allred You will receive a reminder letter in the mail two months in advance. If you don't receive a letter, please call our office to schedule the follow-up appointment.   Remote monitoring is used to monitor your Pacemaker or ICD from home. This monitoring reduces the number of office visits required to check your device to one time per year. It allows us to keep an eye on the functioning of your device to ensure it is working properly. You are scheduled for a device check from home on 05/14/13. You may send your transmission at any time that day. If you have a wireless device, the transmission will be sent automatically. After your physician reviews your transmission, you will receive a postcard with your next transmission date.   

## 2013-02-12 ENCOUNTER — Observation Stay (HOSPITAL_COMMUNITY)
Admission: RE | Admit: 2013-02-12 | Discharge: 2013-02-13 | Disposition: A | Discharge status: Home / Self Care | Payer: Medicare Other | Source: Ambulatory Visit | Attending: Vascular Surgery | Admitting: Vascular Surgery

## 2013-02-12 ENCOUNTER — Encounter (HOSPITAL_COMMUNITY): Payer: Self-pay | Admitting: Pharmacy Technician

## 2013-02-12 ENCOUNTER — Encounter (HOSPITAL_COMMUNITY)
Admission: RE | Disposition: A | Discharge status: Home / Self Care | Payer: Self-pay | Source: Ambulatory Visit | Attending: Vascular Surgery

## 2013-02-12 ENCOUNTER — Encounter (HOSPITAL_COMMUNITY): Payer: Self-pay | Admitting: *Deleted

## 2013-02-12 DIAGNOSIS — I714 Abdominal aortic aneurysm, without rupture, unspecified: Secondary | ICD-10-CM

## 2013-02-12 DIAGNOSIS — I4892 Unspecified atrial flutter: Secondary | ICD-10-CM | POA: Insufficient documentation

## 2013-02-12 DIAGNOSIS — I4891 Unspecified atrial fibrillation: Secondary | ICD-10-CM | POA: Insufficient documentation

## 2013-02-12 DIAGNOSIS — Z87891 Personal history of nicotine dependence: Secondary | ICD-10-CM | POA: Insufficient documentation

## 2013-02-12 DIAGNOSIS — I739 Peripheral vascular disease, unspecified: Secondary | ICD-10-CM | POA: Insufficient documentation

## 2013-02-12 DIAGNOSIS — E785 Hyperlipidemia, unspecified: Secondary | ICD-10-CM | POA: Insufficient documentation

## 2013-02-12 DIAGNOSIS — I10 Essential (primary) hypertension: Secondary | ICD-10-CM | POA: Insufficient documentation

## 2013-02-12 DIAGNOSIS — Z7901 Long term (current) use of anticoagulants: Secondary | ICD-10-CM | POA: Insufficient documentation

## 2013-02-12 DIAGNOSIS — M109 Gout, unspecified: Secondary | ICD-10-CM | POA: Insufficient documentation

## 2013-02-12 DIAGNOSIS — I495 Sick sinus syndrome: Secondary | ICD-10-CM | POA: Insufficient documentation

## 2013-02-12 DIAGNOSIS — Z95828 Presence of other vascular implants and grafts: Secondary | ICD-10-CM

## 2013-02-12 DIAGNOSIS — Z9889 Other specified postprocedural states: Secondary | ICD-10-CM | POA: Insufficient documentation

## 2013-02-12 DIAGNOSIS — Z85820 Personal history of malignant melanoma of skin: Secondary | ICD-10-CM | POA: Insufficient documentation

## 2013-02-12 DIAGNOSIS — D649 Anemia, unspecified: Secondary | ICD-10-CM | POA: Insufficient documentation

## 2013-02-12 DIAGNOSIS — Z950 Presence of cardiac pacemaker: Secondary | ICD-10-CM | POA: Insufficient documentation

## 2013-02-12 DIAGNOSIS — I745 Embolism and thrombosis of iliac artery: Secondary | ICD-10-CM | POA: Insufficient documentation

## 2013-02-12 HISTORY — PX: ABDOMINAL AORTAGRAM: SHX5706

## 2013-02-12 HISTORY — DX: Personal history of other medical treatment: Z92.89

## 2013-02-12 HISTORY — DX: Anemia, unspecified: D64.9

## 2013-02-12 HISTORY — DX: Gout, unspecified: M10.9

## 2013-02-12 HISTORY — DX: Presence of cardiac pacemaker: Z95.0

## 2013-02-12 HISTORY — PX: ABDOMINAL AORTAGRAM: SHX5454

## 2013-02-12 HISTORY — DX: Basal cell carcinoma of skin of scalp and neck: C44.41

## 2013-02-12 LAB — POCT I-STAT, CHEM 8
BUN: 27 mg/dL — ABNORMAL HIGH (ref 6–23)
Calcium, Ion: 1.26 mmol/L (ref 1.13–1.30)
Chloride: 105 mEq/L (ref 96–112)
Creatinine, Ser: 1.3 mg/dL (ref 0.50–1.35)
Glucose, Bld: 104 mg/dL — ABNORMAL HIGH (ref 70–99)
HEMATOCRIT: 37 % — AB (ref 39.0–52.0)
HEMOGLOBIN: 12.6 g/dL — AB (ref 13.0–17.0)
POTASSIUM: 4.1 meq/L (ref 3.7–5.3)
Sodium: 142 mEq/L (ref 137–147)
TCO2: 25 mmol/L (ref 0–100)

## 2013-02-12 LAB — PROTIME-INR
INR: 1.22 (ref 0.00–1.49)
PROTHROMBIN TIME: 15.1 s (ref 11.6–15.2)

## 2013-02-12 SURGERY — ABDOMINAL AORTAGRAM
Anesthesia: LOCAL

## 2013-02-12 MED ORDER — DEXTROSE-NACL 5-0.45 % IV SOLN
INTRAVENOUS | Status: DC
  Administered 2013-02-12: 15:00:00 via INTRAVENOUS

## 2013-02-12 MED ORDER — LISINOPRIL 20 MG PO TABS
20.0000 mg | ORAL_TABLET | Freq: Every day | ORAL | Status: DC
  Administered 2013-02-12 – 2013-02-13 (×2): 20 mg via ORAL
  Filled 2013-02-12 (×2): qty 1

## 2013-02-12 MED ORDER — MAGNESIUM SULFATE 40 MG/ML IJ SOLN
2.0000 g | Freq: Every day | INTRAMUSCULAR | Status: DC | PRN
  Filled 2013-02-12: qty 50

## 2013-02-12 MED ORDER — HEPARIN (PORCINE) IN NACL 2-0.9 UNIT/ML-% IJ SOLN
INTRAMUSCULAR | Status: AC
  Filled 2013-02-12: qty 1000

## 2013-02-12 MED ORDER — ALUM & MAG HYDROXIDE-SIMETH 200-200-20 MG/5ML PO SUSP: 15.0000 mL | ORAL | Status: DC | PRN

## 2013-02-12 MED ORDER — DIGOXIN 125 MCG PO TABS
125.0000 ug | ORAL_TABLET | Freq: Every day | ORAL | Status: DC
  Administered 2013-02-12 – 2013-02-13 (×2): 125 ug via ORAL
  Filled 2013-02-12 (×3): qty 1

## 2013-02-12 MED ORDER — ONDANSETRON HCL 4 MG/2ML IJ SOLN: 4.0000 mg | Freq: Four times a day (QID) | INTRAMUSCULAR | Status: DC | PRN

## 2013-02-12 MED ORDER — FINASTERIDE 5 MG PO TABS
5.0000 mg | ORAL_TABLET | Freq: Every day | ORAL | Status: DC
  Administered 2013-02-12 – 2013-02-13 (×2): 5 mg via ORAL
  Filled 2013-02-12 (×2): qty 1

## 2013-02-12 MED ORDER — LIDOCAINE HCL (PF) 1 % IJ SOLN
INTRAMUSCULAR | Status: AC
  Filled 2013-02-12: qty 30

## 2013-02-12 MED ORDER — LABETALOL HCL 5 MG/ML IV SOLN: 10.0000 mg | INTRAVENOUS | Status: DC | PRN

## 2013-02-12 MED ORDER — METOPROLOL TARTRATE 25 MG PO TABS
25.0000 mg | ORAL_TABLET | Freq: Two times a day (BID) | ORAL | Status: DC
  Administered 2013-02-12 – 2013-02-13 (×2): 25 mg via ORAL
  Filled 2013-02-12 (×2): qty 1

## 2013-02-12 MED ORDER — LIDOCAINE HCL (PF) 1 % IJ SOLN
INTRAMUSCULAR | Status: AC
Start: 2013-02-12 — End: 2013-02-12
  Filled 2013-02-12: qty 30

## 2013-02-12 MED ORDER — OXYCODONE HCL 5 MG PO TABS
5.0000 mg | ORAL_TABLET | ORAL | Status: DC | PRN
Start: 2013-02-12 — End: 2013-05-06

## 2013-02-12 MED ORDER — SIMVASTATIN 20 MG PO TABS
20.0000 mg | ORAL_TABLET | Freq: Every day | ORAL | Status: DC
  Filled 2013-02-12 (×2): qty 1

## 2013-02-12 MED ORDER — ALLOPURINOL 300 MG PO TABS
300.0000 mg | ORAL_TABLET | Freq: Every day | ORAL | Status: DC
Start: 2013-02-12 — End: 2013-02-13
  Administered 2013-02-12 – 2013-02-13 (×2): 300 mg via ORAL
  Filled 2013-02-12 (×2): qty 1

## 2013-02-12 MED ORDER — CALCIUM CARBONATE 1250 (500 CA) MG PO TABS
1.0000 | ORAL_TABLET | Freq: Every day | ORAL | Status: DC
Start: 2013-02-12 — End: 2013-02-13
  Administered 2013-02-12 – 2013-02-13 (×2): 500 mg via ORAL
  Filled 2013-02-12 (×3): qty 1

## 2013-02-12 MED ORDER — PANTOPRAZOLE SODIUM 40 MG PO TBEC
40.0000 mg | DELAYED_RELEASE_TABLET | Freq: Every day | ORAL | Status: DC
  Administered 2013-02-12 – 2013-02-13 (×2): 40 mg via ORAL
  Filled 2013-02-12 (×2): qty 1

## 2013-02-12 MED ORDER — SODIUM CHLORIDE 0.9 % IV SOLN
INTRAVENOUS | Status: DC
  Administered 2013-02-12: 100 mL/h via INTRAVENOUS

## 2013-02-12 MED ORDER — POTASSIUM CHLORIDE CRYS ER 20 MEQ PO TBCR: 20.0000 meq | EXTENDED_RELEASE_TABLET | Freq: Every day | ORAL | Status: DC | PRN

## 2013-02-12 MED ORDER — MORPHINE SULFATE 2 MG/ML IJ SOLN: 2.0000 mg | INTRAMUSCULAR | Status: DC | PRN

## 2013-02-12 MED ORDER — FOSINOPRIL SODIUM 20 MG PO TABS
20.0000 mg | ORAL_TABLET | Freq: Every day | ORAL | Status: DC
  Filled 2013-02-12: qty 1

## 2013-02-12 MED ORDER — FENTANYL CITRATE 0.05 MG/ML IJ SOLN
INTRAMUSCULAR | Status: AC
Start: 2013-02-12 — End: 2013-02-12
  Filled 2013-02-12: qty 2

## 2013-02-12 MED ORDER — METOPROLOL TARTRATE 1 MG/ML IV SOLN
2.0000 mg | INTRAVENOUS | Status: DC | PRN
  Filled 2013-02-12: qty 5

## 2013-02-12 MED ORDER — OXYCODONE HCL 5 MG PO TABS: 5.0000 mg | ORAL_TABLET | ORAL | Status: DC | PRN

## 2013-02-12 MED ORDER — HYDRALAZINE HCL 20 MG/ML IJ SOLN: 10.0000 mg | INTRAMUSCULAR | Status: DC | PRN

## 2013-02-12 MED ORDER — ACETAMINOPHEN 325 MG PO TABS: 325.0000 mg | ORAL_TABLET | ORAL | Status: DC | PRN

## 2013-02-12 MED ORDER — ACETAMINOPHEN 650 MG RE SUPP: 325.0000 mg | RECTAL | Status: DC | PRN

## 2013-02-12 NOTE — Progress Notes (Addendum)
Pt has embolization of his left internal iliac artery today.  He will be admitted to make sure he has no symptoms of colon ischemia overnight.  He needs to stay off his coumadin an additional 72 hours then resume his normal dose on 02/15/13.  He is not a candidate for open repair and this was discussed with the family.  If his aneurysm was to rupture no further interventions at this point.  Ruta Hinds, MD [facility] [place] Office: [phone] Pager: [phone]

## 2013-02-12 NOTE — Interval H&P Note (Signed)
History and Physical Interval Note:  02/12/2013 10:16 AM  [name] [name].  has presented today for surgery, with the diagnosis of Triple A  The various methods of treatment have been discussed with the patient and family. After consideration of risks, benefits and other options for treatment, the patient has consented to  Procedure(s): ABDOMINAL AORTAGRAM (N/A) as a surgical intervention .  The patient's history has been reviewed, patient examined, no change in status, stable for surgery.  I have reviewed the patient's chart and labs.  Questions were answered to the patient's satisfaction.     FIELDS,CHARLES E

## 2013-02-12 NOTE — Op Note (Signed)
Procedure: Abdominal aortogram, coil embolization of left internal iliac artery  Preoperative diagnosis: Abdominal aortic aneurysm with endoleak  Postoperative diagnosis: Same  Anesthesia: Local with IV sedation  Indications: Patient is a 80 year old male with previous endovascular stent graft repair of abdominal aortic aneurysm. The stent graft has had multiple revisions in the past. The patient has had increased growth of his aortic diameter. He presents for abdominal aortogram to determine site of possible endoleak.  Operative findings: #1 no obvious type I proximal endoleak #2 possible type II endoleak from left internal iliac artery branch #3 40 Nester coils in one 12 Amplatzer plug left internal iliac artery  Operative details: After obtaining informed consent, the patient was taken to the [facility] lab. The patient was placed in supine position on the operating table. Next both groins were prepped and draped in usual sterile fashion. Local anesthesia was inferior to the right common femoral artery. Ultrasound was used to identify the right common femoral artery. An introducer needle was then used to cannulate the right common femoral artery. An 20 versacore was threaded up into the right iliac system under fluoroscopic guidance. Due too dense scar tissue the tract to the artery was initially dilated with a 6 [place] dilator. Next a 5 French sheath was over the guidewire the right common femoral artery and thoroughly flushed with heparinized saline. A 5 French pigtail catheter was then placed over the guidewire into the abdominal aorta. An abdominal aortogram was then obtained in AP and lateral projection. This showed no filling of the aneurysm sac. There was no evidence of proximal type I endoleak. The left and right renal arteries are patent. There is aneurysm stent graft with a suprarenal fixation which is intact. The native left common iliac system is occluded. The patient has a right to left  femoral-femoral bypass which is patent. The left external iliac artery fills retrograde from the femoral-femoral bypass. This then fills the iliac bifurcation as well as the internal iliac artery. There is one branch coming off of the internal iliac artery proceeds from inferior to superior it appeared to possibly be supplying the aneurysm sac. However, there was no actual filling of the aneurysm sac. It was decided to coil embolize this to take this out as a possible type II endoleak source. I also consulted with my partner Dr. Trula [place] who agreed to coil embolization would be the best option to exclude any type II leak.  Attention was then turned to the left groin. Ultrasound was used to identify the left common femoral artery. An introducer needle was used to cannulate the left common femoral artery but the guidewire would not advance. Therefore this was removed and hemostasis obtained with direct pressure. An attempt was then made using a micropuncture sheath. However due to scar tissue the micropuncture sheath would not advance easily and the wire became kinked. This was removed and hemostasis obtained with direct pressure. An additional attempt to access the artery was then made using the femoral head as an anatomic landmark. The artery was cannulated and an 035 versacore was threaded into the artery but again would not head into the external iliac artery. An 035 angled Glidewire was then brought up and this was advanced into the left external iliac artery and up into the internal iliac artery under fluoroscopic guidance. Next the tract was dilated with a 6 [place] dilator. A 6 French bright tip was sheath was then placed over the guidewire and advanced all the way into the left internal  iliac artery. Selective contrast angiogram of the internal iliac artery was performed and this again identified several potential type II endoleak branches. A 5 French crossover catheter was brought in operative field this was  advanced over guidewire deep into the distal most portions of the internal iliac artery. I then proceeded to back fill this with multiple Nester coils. A total of 40 coils were deployed. A 12 Amplatzer plug was then brought up in the operative field and capped over these coils.  There was still some filling of the distal branches of the internal iliac artery but this was very sluggish and it appeared that the artery was occluded over time. At this point the only other option would be to back fill the native external iliac artery with coils or ligating the artery and I decided to see what the results of this coil embolization would be before proceeding to that. At this point the patient was taken to the holding area with the sheath in place. Each was flushed thoroughly with heparinized saline. The patient tolerated the procedure well and there were no complications. The patient was taken to the holding area in stable condition.  Ruta Hinds, MD [facility] [place] Office: [phone] Pager: [phone]

## 2013-02-12 NOTE — H&P (View-Only) (Signed)
Patient is a 80 year old male whom I first met 2 years ago. He had initially treatment of in infrarenal abdominal aortic aneurysm with an Ancure graft in [facility] on  11/06/1998. He subsequently had treatment in [place] which with what sounds like coiling of what was described as a type 1 endoleak. I do not have any records of that procedure. He subsequently underwent an extensive operation which he reports took 9 hours at [facility] in August 2006. At that time he had his graft converted to an aortouniiliac and had a fem-fem bypass. Apparently had a type 1 endo leak from the right limb of his aorta femoral Endograft. Following that surgery in 2006 he had extensive treatment in [place] with wound problems from his fem-fem bypass, but subsequently healed these.  His CT scan in August 2008 showing maximal size of his aneurysm sac at 8.5 cm. she then presented with a ruptured 10 cm infrarenal aneurysm in 2012. This was repaired with a 36 x 116 Cook Renu graft and a 36 x 50 proximal cuff.   He was seen in December 2013 at which time the aneurysm was 11 cm in diameter but no obvious endoleak seen.  He returns today for further followup. He denies any abdominal or back pain. He states he recently had some work done on his pacemaker but feels overall well. He, otherwise, is very active and healthy, and is currently living in [place]. He is on Coumadin for atrial fibrillation.  Past Medical History  Diagnosis Date  . Atrial fibrillation     paroxysmal  . Atrial flutter   . Unspecified essential hypertension   . Anal fissure   . Melanoma   . Hyperlipidemia   . PVD (peripheral vascular disease)     s/p fem-fem bypass grafting 2006  . AAA (abdominal aortic aneurysm)     s/p graft repair 2000  . SSS (sick sinus syndrome)     s/p Medtronic PPM implantation 07/2008  . Arthritis   . Mild claudication    Past Surgical History  Procedure Laterality Date  . Melanoma excision      4 times  .  Cataract extraction    . Hernia repair    . Pacemaker insertion    . Tonsillectomy    . Abdominal aortic aneurysm repair    . Aortic stent graft    . US echocardiography  07/15/2008    EF 55-60%  . Endovascular stent insertion  01/09/2011    Procedure: ENDOVASCULAR STENT GRAFT INSERTION;  Surgeon: Elam Dutch, MD;  Location: [place];  Service: Vascular;  Laterality: Right;  With Left  Brachial artery cut-down   Review of systems: He denies shortness of breath. He denies chest pain.  History   Social History  . Marital Status: Married    Spouse Name: N/A    Number of Children: 3  . Years of Education: N/A   Occupational History  . retired Nature conservation officer    Social History Main Topics  . Smoking status: Former Smoker -- 32 years    Types: Cigarettes    Quit date: 02/12/1971  . Smokeless tobacco: Never Used  . Alcohol Use: 1.8 oz/week    2 Glasses of wine, 1 Cans of beer per week     Comment: occasionally  . Drug Use: No  . Sexual Activity: Not on file   Other Topics Concern  . Not on file   Social History Narrative  . No narrative on file   he  is a former prisoner of war during the [place] conflict.  Physical exam:  Filed Vitals:   01/28/13 1102  BP: 122/69  Pulse: 73  Height: 5' 9" (1.753 m)  Weight: 168 lb 8 oz (76.431 kg)  SpO2: 98%    Abdomen: Soft nontender nondistended no pulsatile mass 2+ femoral pulses bilaterally Chest: Clear to auscultation bilaterally Cardiac: Regular rate and rhythm  Data: CT angiogram of the abdomen and pelvis is reviewed today. This shows aneurysm is now 12.7 cm in diameter. Radiologic interpretation suggested a type II endoleak. However, I am unable to specifically identify this due to the large amount of calcium within the aneurysm sac. There is no question that the aneurysm has grown in diameter. The suprarenal aorta is dilated as well.  Assessment: Increase in diameter abdominal aortic aneurysm status post endovascular repair with  multiple revisions at risk for rupture. The patient is 80 years old open repair would essentially require thoracoabdominal aneurysm repair. He is probably not a very good candidate for this. He may also have limited endovascular options at this point. I believe the best option would be to repeat his aortogram to see if there is any percutaneous option and further define the endoleak. There is a possibility of a fenestrated graft perhaps. However further discussions with the patient after his arteriogram. This is scheduled for January 2. Risks benefits possible complications procedure details were trying to the patient today. I also discussed with him the high risk of rupture.  He also understands there are limited possibilities for further repair.  Plan: See above  Charles Fields, MD [facility] [place] Office: [phone] Pager: [phone]    

## 2013-02-13 LAB — BASIC METABOLIC PANEL
BUN: 26 mg/dL — ABNORMAL HIGH (ref 6–23)
CHLORIDE: 105 meq/L (ref 96–112)
CO2: 27 mEq/L (ref 19–32)
Calcium: 8.6 mg/dL (ref 8.4–10.5)
Creatinine, Ser: 1.22 mg/dL (ref 0.50–1.35)
GFR, EST AFRICAN AMERICAN: 58 mL/min — AB (ref >90)
GFR, EST NON AFRICAN AMERICAN: 50 mL/min — AB (ref >90)
Glucose, Bld: 101 mg/dL — ABNORMAL HIGH (ref 70–99)
Potassium: 4.7 mEq/L (ref 3.7–5.3)
Sodium: 140 mEq/L (ref 137–147)

## 2013-02-13 LAB — CBC
HCT: 31.4 % — ABNORMAL LOW (ref 39.0–52.0)
Hemoglobin: 10.7 g/dL — ABNORMAL LOW (ref 13.0–17.0)
MCH: 34.2 pg — ABNORMAL HIGH (ref 26.0–34.0)
MCHC: 34.1 g/dL (ref 30.0–36.0)
MCV: 100.3 fL — AB (ref 78.0–100.0)
Platelets: 88 10*3/uL — ABNORMAL LOW (ref 150–400)
RBC: 3.13 MIL/uL — ABNORMAL LOW (ref 4.22–5.81)
RDW: 15.6 % — AB (ref 11.5–15.5)
WBC: 7.5 10*3/uL (ref 4.0–10.5)

## 2013-02-13 MED ORDER — WARFARIN SODIUM 2.5 MG PO TABS: 2.5000 mg | ORAL_TABLET | Freq: Every day | ORAL | Status: DC

## 2013-02-13 NOTE — Progress Notes (Addendum)
[facility] [place]  Subjective  - I am feeling fine.  No new complaints.  No abdomminal pain or back pain this am.     Objective 95/49 74 98.6 F (37 C) (Oral) 20 97%  Intake/Output Summary (Last 24 hours) at 02/13/13 0754 Last data filed at 02/13/13 0700  Gross per 24 hour  Intake 951.67 ml  Output   1200 ml  Net -248.33 ml    Groin incisions clean and dry without hematoma. Distally feet are warm and well perfused.   Assessment/Planning: POD #1 Abdominal aortogram, coil embolization of left internal iliac artery We will discharge him home this am He has A fib and has had episodes of tachycardia in the 130's to the 150's. We will give him his metoprolol this am along with his other regular medications. He will resume his coumadin on Mon. 02/16/2012. F/U in the office in 1 month.  Laurence Slate [facility] 02/13/2013 7:54 AM --  Laboratory Lab Results:  Recent Labs  02/12/13 0953 02/13/13 0345  WBC  --  7.5  HGB 12.6* 10.7*  HCT 37.0* 31.4*  PLT  --  88*   BMET  Recent Labs  02/12/13 0953 02/13/13 0345  NA 142 140  K 4.1 4.7  CL 105 105  CO2  --  27  GLUCOSE 104* 101*  BUN 27* 26*  CREATININE 1.30 1.22  CALCIUM  --  8.6    COAG Lab Results  Component Value Date   INR 1.22 02/12/2013   INR 2.68* 10/23/2012   INR 3.1* 10/21/2012   No results found for this basename: PTT   Abdomen soft nontender Heart rate 71 Patient with no complaints  DC home today-followup with Dr. Oneida Alar in one month

## 2013-02-15 NOTE — Discharge Summary (Signed)
[facility] Discharge Summary   Patient ID:  [name] [name]. MRN: [id number] DOB/AGE: 18-Feb-1919 80 y.o.  Admit date: 02/12/2013 Discharge date: 02/15/2013 Date of Surgery: 02/12/2013 Surgeon: Surgeon(s): Elam Dutch, MD  Admission Diagnosis: Triple A  Discharge Diagnoses:  Triple A  Secondary Diagnoses: Past Medical History  Diagnosis Date  . Atrial fibrillation     paroxysmal  . Atrial flutter   . Unspecified essential hypertension   . Anal fissure   . Hyperlipidemia   . PVD (peripheral vascular disease)     s/p fem-fem bypass grafting 2006  . AAA (abdominal aortic aneurysm)     s/p graft repair 2000  . SSS (sick sinus syndrome)     s/p Medtronic PPM implantation 07/2008  . Mild claudication   . Pacemaker   . Anemia   . History of blood transfusion     "bleeding out rectum" (02/12/2013)  . Gout   . Arthritis     "touches"  . Basal cell carcinoma of head     "4 times"    Procedure(s): ABDOMINAL AORTAGRAM EMBOLIZATION  Discharged Condition: good  HPI: Patient is a 80 year old male whom I first met 2 years ago. He had initially treatment of in infrarenal abdominal aortic aneurysm with an Ancure graft in [facility] on  11/06/1998. He subsequently had treatment in [place] which with what sounds like coiling of what was described as a type 1 endoleak. I do not have any records of that procedure. He subsequently underwent an extensive operation which he reports took 9 hours at [facility] in August 2006. At that time he had his graft converted to an aortouniiliac and had a fem-fem bypass. Apparently had a type 1 endo leak from the right limb of his aorta femoral Endograft. Following that surgery in 2006 he had extensive treatment in [place] with wound problems from his fem-fem bypass, but subsequently healed these. His CT scan in August 2008 showing maximal size of his aneurysm sac at 8.5 cm. she then presented with a ruptured 10 cm infrarenal  aneurysm in 2012. This was repaired with a 36 x 116 Cook Renu graft and a 36 x 50 proximal cuff. He was seen in December 2013 at which time the aneurysm was 11 cm in diameter but no obvious endoleak seen. He returns today for further followup. He denies any abdominal or back pain. He states he recently had some work done on his pacemaker but feels overall well. He, otherwise, is very active and healthy, and is currently living in [place]. He is on Coumadin for atrial fibrillation.  He under went Abdominal aortogram, coil embolization of left internal iliac artery by Dr. Oneida Alar on 02/12/2013.  He will be admitted to make sure he has no symptoms of colon ischemia overnight. He needs to stay off his coumadin an additional 72 hours then resume his normal dose on 02/15/13. He is not a candidate for open repair and this was discussed with the family. If his aneurysm was to rupture no further interventions at this point.  POD #1 he was ambulating, taking PO's well no complaints of abdominal or lumbar pain.  He has A fib and has had episodes of tachycardia in the 130's to the 150's.  He was given his Digoxin and metoprolol which reduced his HR to 70's.  He was discharged home in good condition.        Hospital Course:  [name] [name]. is a 80 y.o. male is S/P Neither  Procedure(s): ABDOMINAL AORTAGRAM with coil embolization of left internal iliac artery  Extubated: POD # 0 Physical exam: Groin incisions clean and dry without hematoma.  Distally feet are warm and well perfused.  Post-op wounds healing well Pt. Ambulating, voiding and taking PO diet without difficulty. Pt pain controlled with PO pain meds. Labs as below Complications:none  Consults:     Significant Diagnostic Studies: CBC Lab Results  Component Value Date   WBC 7.5 02/13/2013   HGB 10.7* 02/13/2013   HCT 31.4* 02/13/2013   MCV 100.3* 02/13/2013   PLT 88* 02/13/2013    BMET    Component Value Date/Time   NA 140 02/13/2013 0345    K 4.7 02/13/2013 0345   CL 105 02/13/2013 0345   CO2 27 02/13/2013 0345   GLUCOSE 101* 02/13/2013 0345   BUN 26* 02/13/2013 0345   CREATININE 1.22 02/13/2013 0345   CREATININE 1.23 01/25/2013 1033   CALCIUM 8.6 02/13/2013 0345   GFRNONAA 50* 02/13/2013 0345   GFRAA 58* 02/13/2013 0345   COAG Lab Results  Component Value Date   INR 1.22 02/12/2013   INR 2.68* 10/23/2012   INR 3.1* 10/21/2012     Disposition:  Discharge to :Home Discharge Orders   Future Appointments Provider Department Dept Phone   05/14/2013 8:30 AM [facility] [place] Office [phone]   Future Orders Complete By Expires   Call MD for:  redness, tenderness, or signs of infection (pain, swelling, bleeding, redness, odor or green/yellow discharge around incision site)  As directed    Call MD for:  severe or increased pain, loss or decreased feeling  in affected limb(s)  As directed    Call MD for:  temperature >100.5  As directed    Discharge patient  As directed    Comments:     Discharge pt to home   Driving Restrictions  As directed    Comments:     No driving for 24 hours   Lifting restrictions  As directed    Comments:     No lifting for 6 weeks   Resume previous diet  As directed        Medication List         allopurinol 300 MG tablet  Commonly known as:  ZYLOPRIM  Take 300 mg by mouth daily.     CALCIUM CARBONATE PO  Take 600 mg by mouth 2 (two) times daily.     digoxin 0.125 MG tablet  Commonly known as:  LANOXIN  Take 125 mcg by mouth daily.     finasteride 5 MG tablet  Commonly known as:  PROSCAR  Take 5 mg by mouth daily.     fosinopril 40 MG tablet  Commonly known as:  MONOPRIL  Take 20 mg by mouth daily.     metoprolol 50 MG tablet  Commonly known as:  LOPRESSOR  Take 25 mg by mouth 2 (two) times daily.     oxyCODONE 5 MG immediate release tablet  Commonly known as:  Oxy IR/ROXICODONE  Take 1-2 tablets (5-10 mg total) by mouth every 4 (four) hours as needed  for moderate pain.     simvastatin 20 MG tablet  Commonly known as:  ZOCOR  Take 20 mg by mouth at bedtime.     warfarin 2.5 MG tablet  Commonly known as:  COUMADIN  Take 1 tablet (2.5 mg total) by mouth daily.       Verbal and written Discharge instructions given  to the patient. Wound care per Discharge AVS     Follow-up Information   Follow up with Elam Dutch, MD In 1 month. (sent)    Specialty:  Vascular Surgery   Contact information:   [address] [place] [address] [phone]       Signed: Laurence Slate [facility] 02/15/2013, 1:57 PM

## 2013-02-15 NOTE — Telephone Encounter (Signed)
No other info °

## 2013-02-16 ENCOUNTER — Other Ambulatory Visit: Payer: Self-pay | Admitting: *Deleted

## 2013-02-16 DIAGNOSIS — I714 Abdominal aortic aneurysm, without rupture, unspecified: Secondary | ICD-10-CM

## 2013-02-16 DIAGNOSIS — Z48812 Encounter for surgical aftercare following surgery on the circulatory system: Secondary | ICD-10-CM

## 2013-02-17 ENCOUNTER — Telehealth: Payer: Self-pay | Admitting: Vascular Surgery

## 2013-02-17 NOTE — Telephone Encounter (Signed)
Message copied by Gena Fray on Wed Feb 17, 2013  4:37 PM ------      Message from: Peter Minium K      Created: Wed Feb 17, 2013  3:59 PM      Regarding: Cancel       Can you cancel this CT? I cancelled the order.       ----- Message -----         From: Elam Dutch, MD         Sent: 02/17/2013   9:02 AM           To: Mena Goes, CMA            Please cancel his CT for now.  We will repeat his aortogram in one month.  Bethena Roys should have this on the schedule.            Charles       ------

## 2013-02-25 ENCOUNTER — Other Ambulatory Visit: Payer: Self-pay | Admitting: *Deleted

## 2013-02-25 DIAGNOSIS — I714 Abdominal aortic aneurysm, without rupture, unspecified: Secondary | ICD-10-CM

## 2013-02-25 DIAGNOSIS — Z01810 Encounter for preprocedural cardiovascular examination: Secondary | ICD-10-CM

## 2013-03-25 ENCOUNTER — Ambulatory Visit: Payer: Medicare Other | Admitting: Vascular Surgery

## 2013-03-25 ENCOUNTER — Other Ambulatory Visit (HOSPITAL_COMMUNITY): Payer: Medicare Other

## 2013-03-31 ENCOUNTER — Encounter: Payer: Self-pay | Admitting: Vascular Surgery

## 2013-04-01 ENCOUNTER — Encounter: Payer: Self-pay | Admitting: Vascular Surgery

## 2013-04-01 ENCOUNTER — Ambulatory Visit (HOSPITAL_COMMUNITY)
Admission: RE | Admit: 2013-04-01 | Discharge: 2013-04-01 | Disposition: A | Discharge status: Home / Self Care | Payer: Medicare Other | Source: Ambulatory Visit | Attending: Vascular Surgery | Admitting: Vascular Surgery

## 2013-04-01 ENCOUNTER — Ambulatory Visit (INDEPENDENT_AMBULATORY_CARE_PROVIDER_SITE_OTHER): Payer: Medicare Other | Admitting: Vascular Surgery

## 2013-04-01 VITALS — BP 108/57 | HR 75 | Ht 69.0 in | Wt 169.0 lb

## 2013-04-01 DIAGNOSIS — I714 Abdominal aortic aneurysm, without rupture, unspecified: Secondary | ICD-10-CM

## 2013-04-01 DIAGNOSIS — Z9889 Other specified postprocedural states: Secondary | ICD-10-CM | POA: Insufficient documentation

## 2013-04-01 DIAGNOSIS — Z01810 Encounter for preprocedural cardiovascular examination: Secondary | ICD-10-CM

## 2013-04-01 DIAGNOSIS — Z48812 Encounter for surgical aftercare following surgery on the circulatory system: Secondary | ICD-10-CM

## 2013-04-01 MED ORDER — OXYCODONE-ACETAMINOPHEN 5-325 MG PO TABS: 1.0000 | ORAL_TABLET | ORAL | Status: DC | PRN

## 2013-04-01 NOTE — Progress Notes (Signed)
Patient returns for followup today. He underwent coil embolization of the left internal iliac artery on January 2. This was done for progressive enlargement of his abdominal aortic aneurysm after prior stent graft repair. The patient had a list of complaints today. He complains of pain in his left groin especially when lying down at night. There is some radiation to the inner left thigh. He also complains of intermittent lower abdominal pain. He also has constipation requiring MiraLax intermittently. He also complains that he is unable to have this much energy stamina or walk as far as he use to due to pain in his lower extremities. He does not really describe claudication. He states that most of the pain is diffuse throughout both legs. He does not describe nausea or vomiting. He denies weight loss or food fear. He states he has been trying to cut back on his number of calories as he has not been is active. His family had some concerns that he has some mild depression. He is not currently taking any pain medication. Chronic medical problems include atrial fibrillation, hyperlipidemia, sick sinus syndrome, arthritis. All of these have been stable recently. He currently lives in an assisted living.  Review of systems: He has shortness of breath with exertion. He is able to walk with a walker but formally was able to get around with just a cane.  Past Medical History  Diagnosis Date  . Atrial fibrillation     paroxysmal  . Atrial flutter   . Unspecified essential hypertension   . Anal fissure   . Hyperlipidemia   . PVD (peripheral vascular disease)     s/p fem-fem bypass grafting 2006  . AAA (abdominal aortic aneurysm)     s/p graft repair 2000  . SSS (sick sinus syndrome)     s/p Medtronic PPM implantation 07/2008  . Mild claudication   . Pacemaker   . Anemia   . History of blood transfusion     "bleeding out rectum" (02/12/2013)  . Gout   . Arthritis     "touches"  . Basal cell carcinoma of  head     "4 times"   Past Surgical History  Procedure Laterality Date  . Skin cancer excision      4 times "off my head; they were not melamonas" (02/12/2013"  . Cataract extraction w/ intraocular lens  implant, bilateral Bilateral   . Tonsillectomy    . Abdominal aortic aneurysm repair    . Aortic stent graft    . US echocardiography  07/15/2008    EF 55-60%  . Endovascular stent insertion  01/09/2011    Procedure: ENDOVASCULAR STENT GRAFT INSERTION;  Surgeon: Elam Dutch, MD;  Location: [place];  Service: Vascular;  Laterality: Right;  With Left  Brachial artery cut-down  . Abdominal aortagram  02/12/2013    embolism left internal iliac artery  . Inguinal hernia repair Right   . Insert / replace / remove pacemaker  07/19/2008;10/23/2012    MDT Enrhythm, subsequently changed by Dr Rayann Heman due to premature battery failure;generator change, MDT Sherril Croon by Dr Rayann Heman  . Pacemaker generator change  10/23/2012     MDT Sherril Croon by Dr Rayann Heman  . Anal fissure repair  ~ 1952   Physical exam:  Filed Vitals:   04/01/13 1027  BP: 108/57  Pulse: 75  Height: 5\' 9"  (1.753 m)  Weight: 169 lb (76.658 kg)  SpO2: 99%    Neck: No carotid bruits  Chest: Clear to auscultation bilaterally  Cardiac: Regular rate and rhythm without murmur  Abdomen: Soft nontender nondistended no pulsatile mass, left inguinal region no obvious hernia defect no palpable mass no erythema Extremities: 2+ femoral pulses bilaterally, absent popliteal and pedal pulses  Skin: No ulcers Data: Ultrasound abdominal aortic aneurysm today shows aneurysm is 11 cm in diameter no obvious endoleak  Assessment/Plan #1 abdominal aortic aneurysm most likely no further interventions from an endovascular standpoint and do not believe patient would tolerate open repair of his aneurysm which would be a thoracoabdominal aneurysm repair and a 80 year old we will obtain a CT Angio the abdomen and pelvis in 3 months to see if we have any improvement  in the appearance of his aneurysm after coil embolization of his internal iliac   #2 bilateral lower extremity pain did not believe this is necessarily related to peripheral arterial disease because the pain starts at minimal ambulation and most likely this is more musculoskeletal arthritic in nature referral physical therapy into his primary care physician Dr. Sharlett Iles to see whether or not chronic anti-inflammatories may help him. Patient was given a prescription for Percocet for a few weeks today until he could see Dr. Sharlett Iles. Patient was encouraged to make sure he did not skip meals as he will decrease his energy level from this. He is also encouraged to continue physical activities to prevent them from back sliding. He will followup with me in 3 months.  #3 abdominal pain doubt this is related to his abdominal aortic aneurysm as is it is intermittent in nature and primarily involves the lower abdomen and left groin region may be secondary to constipation issues. We'll defer further workup to Dr. Sharlett Iles.  Ruta Hinds, MD [facility] [place] Office: [phone] Pager: [phone]

## 2013-04-02 NOTE — Addendum Note (Signed)
Addended by: Dorthula Rue L on: 04/02/2013 10:56 AM   Modules accepted: Orders

## 2013-05-06 ENCOUNTER — Inpatient Hospital Stay (HOSPITAL_COMMUNITY)
Admission: AD | Admit: 2013-05-06 | Discharge: 2013-05-08 | DRG: 300 | Disposition: A | Discharge status: Home / Self Care | Payer: Medicare Other | Source: Ambulatory Visit | Attending: Internal Medicine | Admitting: Internal Medicine

## 2013-05-06 ENCOUNTER — Encounter (HOSPITAL_COMMUNITY): Payer: Self-pay | Admitting: Internal Medicine

## 2013-05-06 ENCOUNTER — Encounter: Payer: Self-pay | Admitting: Neurology

## 2013-05-06 DIAGNOSIS — Z950 Presence of cardiac pacemaker: Secondary | ICD-10-CM

## 2013-05-06 DIAGNOSIS — E440 Moderate protein-calorie malnutrition: Secondary | ICD-10-CM | POA: Diagnosis present

## 2013-05-06 DIAGNOSIS — I714 Abdominal aortic aneurysm, without rupture, unspecified: Secondary | ICD-10-CM | POA: Diagnosis present

## 2013-05-06 DIAGNOSIS — I739 Peripheral vascular disease, unspecified: Secondary | ICD-10-CM | POA: Diagnosis present

## 2013-05-06 DIAGNOSIS — D696 Thrombocytopenia, unspecified: Secondary | ICD-10-CM | POA: Diagnosis present

## 2013-05-06 DIAGNOSIS — R2681 Unsteadiness on feet: Secondary | ICD-10-CM | POA: Diagnosis present

## 2013-05-06 DIAGNOSIS — Z79899 Other long term (current) drug therapy: Secondary | ICD-10-CM

## 2013-05-06 DIAGNOSIS — D649 Anemia, unspecified: Secondary | ICD-10-CM

## 2013-05-06 DIAGNOSIS — Z66 Do not resuscitate: Secondary | ICD-10-CM | POA: Diagnosis present

## 2013-05-06 DIAGNOSIS — M109 Gout, unspecified: Secondary | ICD-10-CM | POA: Diagnosis present

## 2013-05-06 DIAGNOSIS — I10 Essential (primary) hypertension: Secondary | ICD-10-CM | POA: Diagnosis present

## 2013-05-06 DIAGNOSIS — E785 Hyperlipidemia, unspecified: Secondary | ICD-10-CM | POA: Diagnosis present

## 2013-05-06 DIAGNOSIS — I772 Rupture of artery: Principal | ICD-10-CM

## 2013-05-06 DIAGNOSIS — I4892 Unspecified atrial flutter: Secondary | ICD-10-CM | POA: Diagnosis present

## 2013-05-06 DIAGNOSIS — K921 Melena: Secondary | ICD-10-CM

## 2013-05-06 DIAGNOSIS — Z85828 Personal history of other malignant neoplasm of skin: Secondary | ICD-10-CM

## 2013-05-06 DIAGNOSIS — Z87891 Personal history of nicotine dependence: Secondary | ICD-10-CM

## 2013-05-06 DIAGNOSIS — Z7901 Long term (current) use of anticoagulants: Secondary | ICD-10-CM

## 2013-05-06 DIAGNOSIS — I4891 Unspecified atrial fibrillation: Secondary | ICD-10-CM | POA: Diagnosis present

## 2013-05-06 DIAGNOSIS — D62 Acute posthemorrhagic anemia: Secondary | ICD-10-CM | POA: Diagnosis present

## 2013-05-06 DIAGNOSIS — M4800 Spinal stenosis, site unspecified: Secondary | ICD-10-CM | POA: Diagnosis present

## 2013-05-06 HISTORY — DX: Cardiac arrhythmia, unspecified: I49.9

## 2013-05-06 LAB — IRON AND TIBC
Iron: 16 ug/dL — ABNORMAL LOW (ref 42–135)
SATURATION RATIOS: 10 % — AB (ref 20–55)
TIBC: 159 ug/dL — ABNORMAL LOW (ref 215–435)
UIBC: 143 ug/dL (ref 125–400)

## 2013-05-06 LAB — RETICULOCYTES
RBC.: 1.97 MIL/uL — AB (ref 4.22–5.81)
Retic Count, Absolute: 76.8 10*3/uL (ref 19.0–186.0)
Retic Ct Pct: 3.9 % — ABNORMAL HIGH (ref 0.4–3.1)

## 2013-05-06 LAB — CBC
HCT: 19.4 % — ABNORMAL LOW (ref 39.0–52.0)
Hemoglobin: 6 g/dL — CL (ref 13.0–17.0)
MCH: 30.5 pg (ref 26.0–34.0)
MCHC: 30.9 g/dL (ref 30.0–36.0)
MCV: 98.5 fL (ref 78.0–100.0)
Platelets: 95 10*3/uL — ABNORMAL LOW (ref 150–400)
RBC: 1.97 MIL/uL — AB (ref 4.22–5.81)
RDW: 20.2 % — ABNORMAL HIGH (ref 11.5–15.5)
WBC: 4.4 10*3/uL (ref 4.0–10.5)

## 2013-05-06 LAB — COMPREHENSIVE METABOLIC PANEL
ALBUMIN: 2.3 g/dL — AB (ref 3.5–5.2)
ALT: 8 U/L (ref 0–53)
AST: 14 U/L (ref 0–37)
Alkaline Phosphatase: 47 U/L (ref 39–117)
BUN: 27 mg/dL — ABNORMAL HIGH (ref 6–23)
CO2: 20 mEq/L (ref 19–32)
Calcium: 8 mg/dL — ABNORMAL LOW (ref 8.4–10.5)
Chloride: 105 mEq/L (ref 96–112)
Creatinine, Ser: 1.01 mg/dL (ref 0.50–1.35)
GFR calc Af Amer: 73 mL/min — ABNORMAL LOW (ref >90)
GFR calc non Af Amer: 63 mL/min — ABNORMAL LOW (ref >90)
Glucose, Bld: 94 mg/dL (ref 70–99)
Potassium: 3.5 mEq/L — ABNORMAL LOW (ref 3.7–5.3)
Sodium: 137 mEq/L (ref 137–147)
Total Bilirubin: 0.4 mg/dL (ref 0.3–1.2)
Total Protein: 5.9 g/dL — ABNORMAL LOW (ref 6.0–8.3)

## 2013-05-06 LAB — FOLATE: FOLATE: 5.9 ng/mL

## 2013-05-06 LAB — FERRITIN: Ferritin: 336 ng/mL — ABNORMAL HIGH (ref 22–322)

## 2013-05-06 LAB — PROTIME-INR
INR: 2.48 — ABNORMAL HIGH (ref 0.00–1.49)
Prothrombin Time: 26 seconds — ABNORMAL HIGH (ref 11.6–15.2)

## 2013-05-06 LAB — PREPARE RBC (CROSSMATCH)

## 2013-05-06 LAB — VITAMIN B12: VITAMIN B 12: 517 pg/mL (ref 211–911)

## 2013-05-06 MED ORDER — PANTOPRAZOLE SODIUM 40 MG PO TBEC
40.0000 mg | DELAYED_RELEASE_TABLET | Freq: Every day | ORAL | Status: DC
  Administered 2013-05-07 – 2013-05-08 (×2): 40 mg via ORAL
  Filled 2013-05-06 (×2): qty 1

## 2013-05-06 MED ORDER — SODIUM CHLORIDE 0.9 % IJ SOLN
3.0000 mL | Freq: Two times a day (BID) | INTRAMUSCULAR | Status: DC
  Administered 2013-05-06 – 2013-05-07 (×2): 3 mL via INTRAVENOUS

## 2013-05-06 MED ORDER — ACETAMINOPHEN 325 MG PO TABS: 650.0000 mg | ORAL_TABLET | Freq: Four times a day (QID) | ORAL | Status: DC | PRN

## 2013-05-06 MED ORDER — METOPROLOL TARTRATE 25 MG PO TABS
25.0000 mg | ORAL_TABLET | Freq: Two times a day (BID) | ORAL | Status: DC
  Administered 2013-05-06 – 2013-05-07 (×3): 25 mg via ORAL
  Filled 2013-05-06 (×6): qty 1

## 2013-05-06 MED ORDER — ACETAMINOPHEN 650 MG RE SUPP: 650.0000 mg | Freq: Four times a day (QID) | RECTAL | Status: DC | PRN

## 2013-05-06 MED ORDER — POTASSIUM CHLORIDE IN NACL 20-0.9 MEQ/L-% IV SOLN
INTRAVENOUS | Status: DC
  Administered 2013-05-06: 17:00:00 via INTRAVENOUS
  Filled 2013-05-06 (×4): qty 1000

## 2013-05-06 MED ORDER — BISACODYL 10 MG RE SUPP: 10.0000 mg | Freq: Every day | RECTAL | Status: DC | PRN

## 2013-05-06 MED ORDER — PHYTONADIONE 5 MG PO TABS
5.0000 mg | ORAL_TABLET | Freq: Once | ORAL | Status: AC
  Administered 2013-05-06: 5 mg via ORAL
  Filled 2013-05-06 (×2): qty 1

## 2013-05-06 MED ORDER — DIGOXIN 125 MCG PO TABS: 125.0000 ug | ORAL_TABLET | Freq: Every day | ORAL | Status: DC

## 2013-05-06 MED ORDER — SIMVASTATIN 20 MG PO TABS
20.0000 mg | ORAL_TABLET | Freq: Every day | ORAL | Status: DC
  Administered 2013-05-06 – 2013-05-07 (×2): 20 mg via ORAL
  Filled 2013-05-06 (×3): qty 1

## 2013-05-06 MED ORDER — ALUM & MAG HYDROXIDE-SIMETH 200-200-20 MG/5ML PO SUSP: 30.0000 mL | Freq: Four times a day (QID) | ORAL | Status: DC | PRN

## 2013-05-06 MED ORDER — OXYCODONE HCL 5 MG PO TABS: 5.0000 mg | ORAL_TABLET | ORAL | Status: DC | PRN

## 2013-05-06 MED ORDER — FINASTERIDE 5 MG PO TABS
5.0000 mg | ORAL_TABLET | Freq: Every day | ORAL | Status: DC
  Administered 2013-05-07: 5 mg via ORAL
  Filled 2013-05-06 (×2): qty 1

## 2013-05-06 MED ORDER — ALLOPURINOL 300 MG PO TABS
300.0000 mg | ORAL_TABLET | Freq: Every day | ORAL | Status: DC
  Administered 2013-05-06 – 2013-05-07 (×2): 300 mg via ORAL
  Filled 2013-05-06 (×4): qty 1

## 2013-05-06 MED ORDER — PROMETHAZINE HCL 25 MG PO TABS: 12.5000 mg | ORAL_TABLET | Freq: Four times a day (QID) | ORAL | Status: DC | PRN

## 2013-05-06 NOTE — Consult Note (Signed)
[name] Consult: 3:47 PM 05/06/2013  LOS: 0 days    Referring Provider: Leanna Battles  Primary Care Physician:  Donnajean Lopes, [name] Primary Gastroenterologist:  Dr. Thomasenia Sales     Reason for Consultation:  GI bleed   HPI: [name] is a 80 y.o. male.  On Copumadin for Atrial Fib, s/p pacemaker for SSS. Remote AAA repair.  In 2012 underwent band ligation of internal hemorrhoids, had second flex sig for post ligation bleeding from ulcers at sites of banding.   In last 10 days had INR go to 8, Coumadin dose was reduced.   Having 3 weeks of diarrhea (up to 8 BMs per day estimated by wife), no abdominal pain, some intermittent queasiness and increased belching.  Took Kaopectate on 3/23 along with Imodium.  Stools were black after that for about one day, but then brown again.  Today is first time he has had solid stool in 3 weeks.  No blood in stool.  Has not been on abx in recent months.  Stool studies have not been sent.  He suffers from fecal incontinence, not a new problem.  Routine lab work this week showed Hgb of 6.5.  MCV not in notes.  Previous Hgb not in notes but was 10.7 on 02/13/13 with MCV 100. Do not have results of latest INR but wife says PMD is planning to increase Coumadin dose, but holding Coumadin for now in light of anemia Remote hx of blood transfusion.  anever had EGD or ulcers.  No ASA or NSAIDS No nose or oral bleeding.  No falls, no bruises. No weight loss.   Swelling in both lower legs has greatly improved with wearing compression hosiery Very HOH and this is getting worse.  Set to see neurology next week for this.   Past Medical History  Diagnosis Date  . Atrial fibrillation     paroxysmal  . Atrial flutter   . Unspecified essential hypertension   . Anal fissure   .  Hyperlipidemia   . PVD (peripheral vascular disease)     s/p fem-fem bypass grafting 2006  . AAA (abdominal aortic aneurysm)     s/p graft repair 2000  . SSS (sick sinus syndrome)     s/p Medtronic PPM implantation 07/2008  . Mild claudication   . Pacemaker   . Anemia   . History of blood transfusion     "bleeding out rectum" (02/12/2013)  . Gout   . Arthritis     "touches"  . Basal cell carcinoma of head     "4 times"  . Dysrhythmia     Past Surgical History  Procedure Laterality Date  . Skin cancer excision      4 times "off my head; they were not melamonas" (3/262015"  . Cataract extraction w/ intraocular lens  implant, bilateral Bilateral   . Tonsillectomy    . Abdominal aortic aneurysm repair    . Aortic stent graft    . US echocardiography  07/15/2008    EF 55-60%  . Endovascular stent insertion  01/09/2011  Procedure: ENDOVASCULAR STENT GRAFT INSERTION;  Surgeon: Elam Dutch, [name];  Location: [place];  Service: Vascular;  Laterality: Right;  With Left  Brachial artery cut-down  . Abdominal aortagram  02/12/2013    embolism left internal iliac artery  . Insert / replace / remove pacemaker  07/19/2008;10/23/2012    MDT Enrhythm, subsequently changed by Dr Rayann Heman due to premature battery failure;generator change, MDT Sherril Croon by Dr Rayann Heman  . Pacemaker generator change  10/23/2012     MDT Sherril Croon by Dr Rayann Heman  . Anal fissure repair  ~ 1952  . Inguinal hernia repair Right   . Hemorrhoid banding      Prior to Admission medications   Medication Sig Start Date End Date Taking? Authorizing Provider  allopurinol (ZYLOPRIM) 300 MG tablet Take 300 mg by mouth daily.    Yes Historical Provider, [name]  CALCIUM PO Take 1 tablet by mouth daily.   Yes Historical Provider, [name]  finasteride (PROSCAR) 5 MG tablet Take 5 mg by mouth daily.    Yes Historical Provider, [name]  fosinopril (MONOPRIL) 40 MG tablet Take 20 mg by mouth daily.    Yes Historical Provider, [name]  metoprolol (LOPRESSOR) 50 MG  tablet Take 25 mg by mouth 2 (two) times daily.    Yes Historical Provider, [name]  simvastatin (ZOCOR) 20 MG tablet Take 20 mg by mouth at bedtime.    Yes Historical Provider, [name]  warfarin (COUMADIN) 1 MG tablet Take 1 mg by mouth daily.   Yes Historical Provider, [name]  warfarin (COUMADIN) 2 MG tablet Take 2 mg by mouth daily.    Historical Provider, [name]    Scheduled Meds: . allopurinol  300 mg Oral Daily  . [START ON 05/07/2013] finasteride  5 mg Oral Daily  . metoprolol  25 mg Oral BID  . simvastatin  20 mg Oral QHS  . sodium chloride  3 mL Intravenous Q12H   Infusions: . 0.9 % NaCl with KCl 20 mEq / L     PRN Meds: acetaminophen, acetaminophen, alum & mag hydroxide-simeth, bisacodyl, oxyCODONE, promethazine   Allergies as of 05/06/2013  . (No Known Allergies)    Family History  Problem Relation Age of Onset  . Breast cancer Daughter     granddaughter  . Cancer Daughter   . Diabetes Mother   . Liver disease Mother   . AAA (abdominal aortic aneurysm) Son     History   Social History  . Marital Status: Married    Spouse Name: N/A    Number of Children: 3  . Years of Education: N/A   Occupational History  . retired Nature conservation officer    Social History Main Topics  . Smoking status: Former Smoker -- 1.00 packs/day for 32 years    Types: Cigarettes    Quit date: 02/12/1971  . Smokeless tobacco: Never Used  . Alcohol Use: 0.0 oz/week     Comment: 05/06/2013 "not drinking much alcohol at all now"  . Drug Use: No  . Sexual Activity: Not Currently   Other Topics Concern  . Not on file   Social History Narrative  . No narrative on file    REVIEW OF SYSTEMS: Constitutional:  Strength about the same ENT:  No nose bleeds Pulm:  No new SOB.  Stable DOE CV:  No palpitations, +LE edema.  GU:  No hematuria, no frequency GI:  No dysphagia Heme:  Per HPI   Transfusions:  Per HPI Neuro:  No headaches, no peripheral tingling or numbness Derm:  No itching, no rash or sores.    Endocrine:  No sweats or chills.  No polyuria or dysuria Immunization:  Not queried Travel:  None beyond local counties in last few months.    PHYSICAL EXAM: Vital signs in last 24 hours: Filed Vitals:   05/06/13 1357  BP: 116/52  Pulse: 84  Temp: 97.1 F (36.2 C)  Resp: 18   Wt Readings from Last 3 Encounters:  05/06/13 71.7 kg (158 lb 1.1 oz)  04/01/13 76.658 kg (169 lb)  02/13/13 75.9 kg (167 lb 5.3 oz)  General: elderly WM.  Looks frail.  Extremely HOH despite hearing aid Head:  No asymmetry or signs of trauma  Eyes:  No icterus or pallor Ears:  Very HOH.  Hearing aid in place on left  Nose:  No discharge Mouth:  Clear, moist, tongue midline Neck:  No mass or JVD Lungs:  Clear, diminished BS overall.  Heart: RRR.  Pacemaker on left chest Abdomen:  Soft, NT, ND.  No mass , no HSM.   Rectal: pale greyish brown stool is FOBT +, large and hard prostate.  No mass.  Non irritated, non bloody external hemorrhoids   Musc/Skeltl: + kyphosis Extremities:  No CCE  Neurologic:  Oriented x 3.  HOH.  Moves all 4s.  No tremor Skin:  No telangectasia or rash Tattoos:  none Nodes:  No cervical adenopathy   Psych:  Cooperative, pleasant, relaxed.    LAB RESULTS: Lab Results  Component Value Date   WBC 4.4 05/06/2013   HGB 6.0* 05/06/2013   HCT 19.4* 05/06/2013   MCV 98.5 05/06/2013   PLT 95* 05/06/2013   Lab Results  Component Value Date   INR 2.48* 05/06/2013   INR 1.22 02/12/2013   INR 2.68* 10/23/2012       RADIOLOGY STUDIES: No results found.  ENDOSCOPIC STUDIES: 08/29/10  Flex sig for post hemorrhoidal banding bleed in setting of Pradaxa Ulcers in rectum from recent band ligation.  + stigmata but no active bleeding.   Flex Sig 08/29/10 With band ligation of ulcers.   IMPRESSION:   *  Anemia. MCV unknown.  Anemia studies, CBC pending.  Do not see orders for transfusion, though mentioned in admission note from office. FOBT + and report of limited black stools a few  days ago after taking Bismuth.   *  3 to 4 weeks of diarrhea. No stool studies.  Formed BM today per wife. Taking Imodium at home  *  Hx a fib , chronic Coumadin.  In recent past INR (per wife) up to 8, so dose reduced.  Do not know status of current INR. Coumadin on hold   PLAN:     *  Discontinued the prn dulcolax suppository in light of recent diarrhea. Ordered regular diet for tonight. Keep NPO in case [name] wants EGD tomorrow *  Will add Protonix po given the c/o belching.   *  Do we need to send stool studies or wait until/if diarrhea recurrs? *  Transfuse pt?, will let Dr Philip Aspen decide *  coags in AM.  *  EGD orders in [place].  If coags still prolonged in AM will be diagnostic only.    Azucena Freed  05/06/2013, 3:47 PM Pager: [phone]     [facility] Attending  I have also seen and assessed the patient and agree with the above note. Elderly man with new significant anemia in setting of recently supratherapeutic INR and hx of possible melena. Is trace heme +  now. He also has some weight loss and possible early satiety and new belching.  Because of all of this he and his family have agreed to pursue an EGD tomorroe. The risks and benefits as well as alternatives of endoscopic procedure(s) have been discussed and reviewed. All questions answered. The patient agrees to proceed. I know INR is therapeutic - should still be ok to do a diagnostic EGD.  Gatha Mayer, [name], [facility] [phone] (pager) 05/06/2013 6:07 PM

## 2013-05-06 NOTE — Progress Notes (Signed)
CRITICAL VALUE ALERT  Critical value received:  hgb  Date of notification:  t  Time of notification:  0981  Critical value read back:yes  Nurse who received alert:  Esmeralda Links RN  MD notified (1st page):  Dr.Paterson  Time of first page:  1730  MD notified (2nd page):  Time of second page:  Responding MD:  Dr. Philip Aspen  Time MD responded:  [phone]

## 2013-05-06 NOTE — H&P (Signed)
[name] [name] is an 80 y.o. male.   Chief Complaint: I feel weak HPI:  The patient is a 80 year old Caucasian man who is well known to me.  He has several medical problems as described below, and lately has had persistent loose bowel movements for approximate 4 weeks.  In the past 2 days he's had black bowel movements associated with taking Kaopectate.  He has felt increasingly more weak over the past week.  He has not had bright red blood per rectum.  His appetite has been reduced so showed mild nausea, no vomiting or significant abdominal pain.  He is on chronic anticoagulation because of atrial fibrillation.  A few years ago he had a significant lower GI bleed from hemorrhoidectomy while on Pradaxa.  He is not having significant abdominal pain, dyspnea, or chest discomfort.  He and his wife emphasizes that he would like to continue his DO NOT RESUSCITATE order while in the hospital. Past Medical History  Diagnosis Date  . Atrial fibrillation     paroxysmal  . Atrial flutter   . Unspecified essential hypertension   . Anal fissure   . Hyperlipidemia   . PVD (peripheral vascular disease)     s/p fem-fem bypass grafting 2006  . AAA (abdominal aortic aneurysm)     s/p graft repair 2000  . SSS (sick sinus syndrome)     s/p Medtronic PPM implantation 07/2008  . Mild claudication   . Pacemaker   . Anemia   . History of blood transfusion     "bleeding out rectum" (02/12/2013)  . Gout   . Arthritis     "touches"  . Basal cell carcinoma of head     "4 times"  . Dysrhythmia     Medications Prior to Admission  Medication Sig Dispense Refill  . allopurinol (ZYLOPRIM) 300 MG tablet Take 300 mg by mouth daily.       Marland Kitchen CALCIUM PO Take 1 tablet by mouth daily.      . finasteride (PROSCAR) 5 MG tablet Take 5 mg by mouth daily.       . fosinopril (MONOPRIL) 40 MG tablet Take 20 mg by mouth daily.       . metoprolol (LOPRESSOR) 50 MG tablet Take 25 mg by mouth 2 (two) times daily.       .  simvastatin (ZOCOR) 20 MG tablet Take 20 mg by mouth at bedtime.       Marland Kitchen warfarin (COUMADIN) 1 MG tablet Take 1 mg by mouth daily.      Marland Kitchen warfarin (COUMADIN) 2 MG tablet Take 2 mg by mouth daily.        ADDITIONAL HOME MEDICATIONS: no additional meds  PHYSICIANS INVOLVED IN CARE: Alben Spittle (GI), Ruta Hinds (vsurg), Peter Martinique (card)  Past Surgical History  Procedure Laterality Date  . Skin cancer excision      4 times "off my head; they were not melamonas" (3/262015"  . Cataract extraction w/ intraocular lens  implant, bilateral Bilateral   . Tonsillectomy    . Abdominal aortic aneurysm repair    . Aortic stent graft    . US echocardiography  07/15/2008    EF 55-60%  . Endovascular stent insertion  01/09/2011    Procedure: ENDOVASCULAR STENT GRAFT INSERTION;  Surgeon: Elam Dutch, MD;  Location: [place];  Service: Vascular;  Laterality: Right;  With Left  Brachial artery cut-down  . Abdominal aortagram  02/12/2013    embolism left internal iliac artery  .  Insert / replace / remove pacemaker  07/19/2008;10/23/2012    MDT Enrhythm, subsequently changed by Dr Rayann Heman due to premature battery failure;generator change, MDT Sherril Croon by Dr Rayann Heman  . Pacemaker generator change  10/23/2012     MDT Sherril Croon by Dr Rayann Heman  . Anal fissure repair  ~ 1952  . Inguinal hernia repair Right   . Hemorrhoid banding      Family History  Problem Relation Age of Onset  . Breast cancer Daughter     granddaughter  . Cancer Daughter   . Diabetes Mother   . Liver disease Mother   . AAA (abdominal aortic aneurysm) Son      Social History:  reports that he quit smoking about 42 years ago. His smoking use included Cigarettes. He has a 32 pack-year smoking history. He has never used smokeless tobacco. He reports that he drinks alcohol. He reports that he does not use illicit drugs.  Allergies: Not on File   ROS: anemia, arthritis, heart murmur, heart palpitation, high blood pressure and atrial  fibrillation, unsteady gait, hyperlipidemia, CAD, gout  PHYSICAL EXAM: Blood pressure 116/52, pulse 84, temperature 97.1 F (36.2 C), temperature source Oral, resp. rate 18, height 5' 9"  (1.753 m), weight 71.7 kg (158 lb 1.1 oz), SpO2 100.00%. In general, the patient is a pale elderly white man who looks fatigued was sitting up in a chair.  HEENT exam was significant for pale conjunctivae, neck was supple without jugular venous distention or carotid bruit, chest was clear to auscultation, heart had a slightly irregular rhythm with a grade 2/6 systolic ejection murmur, abdomen had normal bowel sounds and no hepatosplenomegaly or tenderness, he had bilateral 1+ pedal pulses and bilateral 2+ leg edema.  He was alert and answered questions appropriately, and had moderately decreased hearing bilaterally.  He could move all extremities somewhat.  Results for orders placed during the hospital encounter of 05/06/13 (from the past 48 hour(s))  COMPREHENSIVE METABOLIC PANEL     Status: Abnormal   Collection Time    05/06/13  3:55 PM      Result Value Ref Range   Sodium 137  137 - 147 mEq/L   Potassium 3.5 (*) 3.7 - 5.3 mEq/L   Chloride 105  96 - 112 mEq/L   CO2 20  19 - 32 mEq/L   Glucose, Bld 94  70 - 99 mg/dL   BUN 27 (*) 6 - 23 mg/dL   Creatinine, Ser 1.01  0.50 - 1.35 mg/dL   Calcium 8.0 (*) 8.4 - 10.5 mg/dL   Total Protein 5.9 (*) 6.0 - 8.3 g/dL   Albumin 2.3 (*) 3.5 - 5.2 g/dL   AST 14  0 - 37 U/L   ALT 8  0 - 53 U/L   Alkaline Phosphatase 47  39 - 117 U/L   Total Bilirubin 0.4  0.3 - 1.2 mg/dL   GFR calc non Af Amer 63 (*) >90 mL/min   GFR calc Af Amer 73 (*) >90 mL/min   Comment: (NOTE)     The eGFR has been calculated using the CKD EPI equation.     This calculation has not been validated in all clinical situations.     eGFR's persistently <90 mL/min signify possible Chronic Kidney     Disease.  CBC     Status: Abnormal   Collection Time    05/06/13  3:55 PM      Result Value Ref  Range   WBC 4.4  4.0 - 10.5  K/uL   RBC 1.97 (*) 4.22 - 5.81 MIL/uL   Hemoglobin 6.0 (*) 13.0 - 17.0 g/dL   Comment: REPEATED TO VERIFY     CRITICAL RESULT CALLED TO, READ BACK BY AND VERIFIED WITH:     L MILLER RN [id number] 05/06/13 A BROWNING   HCT 19.4 (*) 39.0 - 52.0 %   MCV 98.5  78.0 - 100.0 fL   MCH 30.5  26.0 - 34.0 pg   MCHC 30.9  30.0 - 36.0 g/dL   RDW 20.2 (*) 11.5 - 15.5 %   Platelets 95 (*) 150 - 400 K/uL   Comment: REPEATED TO VERIFY     SPECIMEN CHECKED FOR CLOTS     PLATELETS APPEAR DECREASED     PLATELET COUNT CONFIRMED BY SMEAR  PROTIME-INR     Status: Abnormal   Collection Time    05/06/13  3:55 PM      Result Value Ref Range   Prothrombin Time 26.0 (*) 11.6 - 15.2 seconds   INR 2.48 (*) 0.00 - 1.49  RETICULOCYTES     Status: Abnormal   Collection Time    05/06/13  3:55 PM      Result Value Ref Range   Retic Ct Pct 3.9 (*) 0.4 - 3.1 %   RBC. 1.97 (*) 4.22 - 5.81 MIL/uL   Retic Count, Manual 76.8  19.0 - 186.0 K/uL   No results found.   Assessment/Plan #1 Anemia: he has severe anemia associated with symptoms of profound fatigue.  His anemia is most likely from a slow GI bleed, likely from the upper tract given his recent very dark bowel movements.   Less likely that he has slow bleeding from the lower GI tract or a primary bone marrow disorder.  We will check full anemia workup labs and a stool Hemoccult test.  He will be placed on a proton pump inhibitor for the possibility of a peptic ulcer disease causing his symptoms.  We have requested input from a GI consult regarding further testing such as endoscopy or colonoscopy.  We will transfuse 2 units of packed red blood cells today and recheck his CBC in the morning. #2 CAD: stable on current medications. #3 Abdominal Aortic Aneurysm: Stable after a relatively recent percutaneous vascular treatment.  If his GI workup is unremarkable then we may need to obtain a CT scan of the abdomen and pelvis to reevaluate his  aneurysm. #4 Gait Instability: mild and most likely from spinal stenosis with neurogenic claudication.  Shelsey Rieth G 05/06/2013, 5:58 PM

## 2013-05-07 ENCOUNTER — Encounter (HOSPITAL_COMMUNITY)
Admission: AD | Disposition: A | Discharge status: Home / Self Care | Payer: Self-pay | Source: Ambulatory Visit | Attending: Internal Medicine

## 2013-05-07 ENCOUNTER — Encounter (HOSPITAL_COMMUNITY): Payer: Self-pay | Admitting: Internal Medicine

## 2013-05-07 ENCOUNTER — Inpatient Hospital Stay (HOSPITAL_COMMUNITY): Payer: Medicare Other

## 2013-05-07 HISTORY — PX: ESOPHAGOGASTRODUODENOSCOPY: SHX5428

## 2013-05-07 LAB — CBC
HEMATOCRIT: 24 % — AB (ref 39.0–52.0)
HEMOGLOBIN: 7.8 g/dL — AB (ref 13.0–17.0)
MCH: 30.5 pg (ref 26.0–34.0)
MCHC: 32.5 g/dL (ref 30.0–36.0)
MCV: 93.8 fL (ref 78.0–100.0)
Platelets: 91 10*3/uL — ABNORMAL LOW (ref 150–400)
RBC: 2.56 MIL/uL — ABNORMAL LOW (ref 4.22–5.81)
RDW: 20.7 % — ABNORMAL HIGH (ref 11.5–15.5)
WBC: 4.6 10*3/uL (ref 4.0–10.5)

## 2013-05-07 LAB — PROTIME-INR
INR: 1.69 — ABNORMAL HIGH (ref 0.00–1.49)
Prothrombin Time: 19.4 seconds — ABNORMAL HIGH (ref 11.6–15.2)

## 2013-05-07 SURGERY — EGD (ESOPHAGOGASTRODUODENOSCOPY)
Anesthesia: Moderate Sedation

## 2013-05-07 MED ORDER — FENTANYL CITRATE 0.05 MG/ML IJ SOLN
INTRAMUSCULAR | Status: AC
  Filled 2013-05-07: qty 2

## 2013-05-07 MED ORDER — MIDAZOLAM HCL 5 MG/ML IJ SOLN
INTRAMUSCULAR | Status: AC
  Filled 2013-05-07: qty 1

## 2013-05-07 MED ORDER — IOHEXOL 350 MG/ML SOLN
100.0000 mL | Freq: Once | INTRAVENOUS | Status: AC | PRN
  Administered 2013-05-07: 100 mL via INTRAVENOUS

## 2013-05-07 MED ORDER — MIDAZOLAM HCL 10 MG/2ML IJ SOLN
INTRAMUSCULAR | Status: DC | PRN
  Administered 2013-05-07 (×3): 1 mg via INTRAVENOUS

## 2013-05-07 MED ORDER — SODIUM CHLORIDE 0.9 % IV SOLN
Freq: Once | INTRAVENOUS | Status: AC
  Administered 2013-05-07: 20 mL via INTRAVENOUS

## 2013-05-07 MED ORDER — POLYSACCHARIDE IRON COMPLEX 150 MG PO CAPS
150.0000 mg | ORAL_CAPSULE | Freq: Every day | ORAL | Status: DC
  Administered 2013-05-07: 150 mg via ORAL
  Filled 2013-05-07 (×3): qty 1

## 2013-05-07 MED ORDER — FENTANYL CITRATE 0.05 MG/ML IJ SOLN
INTRAMUSCULAR | Status: DC | PRN
  Administered 2013-05-07: 25 ug via INTRAVENOUS

## 2013-05-07 MED ORDER — BUTAMBEN-TETRACAINE-BENZOCAINE 2-2-14 % EX AERO
INHALATION_SPRAY | CUTANEOUS | Status: DC | PRN
  Administered 2013-05-07: 1 via TOPICAL

## 2013-05-07 NOTE — Op Note (Signed)
[facility] [place], [id number]   ENDOSCOPY PROCEDURE REPORT  PATIENT: [name], [name]  MR#: [id number] BIRTHDATE: 04-11-1919 , 89  yrs. old GENDER: Male ENDOSCOPIST: Gatha Mayer, MD, [facility] REFERRED BY:  Leanna Battles, M.D. PROCEDURE DATE:  05/07/2013 PROCEDURE:  EGD, diagnostic ASA CLASS:     Class III INDICATIONS:  melena. MEDICATIONS: Fentanyl 25 mcg IV and Versed 3 mg IV TOPICAL ANESTHETIC: Cetacaine Spray  DESCRIPTION OF PROCEDURE: After the risks benefits and alternatives of the procedure were thoroughly explained, informed consent was obtained.  The Pentax Gastroscope [id number] endoscope was introduced through the mouth and advanced to the second portion of the duodenum. Without limitations.  The instrument was slowly withdrawn as the mucosa was fully examined.      The upper, middle and distal third of the esophagus were carefully inspected and no abnormalities were noted.  The z-line was well seen at the GEJ.  The endoscope was pushed into the fundus which was normal including a retroflexed view.  The antrum, gastric body, first and second part of the duodenum were unremarkable. Retroflexed views revealed no abnormalities.     The scope was then withdrawn from the patient and the procedure completed.  COMPLICATIONS: There were no complications. ENDOSCOPIC IMPRESSION: Normal EGD  - no cause of blood loss seen  RECOMMENDATIONS: Will check CT angio/abdomen to see if blood loss related to the aortic aneurysm.    eSigned:  Gatha Mayer, MD, [facility] 05/07/2013 3:23 PM

## 2013-05-07 NOTE — Progress Notes (Signed)
MD on call gave verbal order to DC tele and IV fluids per pt's request.    Thalia Bloodgood, Murtis Sink

## 2013-05-07 NOTE — Progress Notes (Signed)
Subjective: He feels fine with no dyspnea, nausea, or abdominal pain. No black bowel movements today.   Objective: Vital signs in last 24 hours: Temp:  [97.1 F (36.2 C)-98.6 F (37 C)] 97.6 F (36.4 C) (03/27 1553) Pulse Rate:  [63-80] 70 (03/27 1553) Resp:  [12-26] 18 (03/27 1553) BP: (95-159)/(33-85) 125/61 mmHg (03/27 1553) SpO2:  [96 %-100 %] 100 % (03/27 1553) Weight:  [73.165 kg (161 lb 4.8 oz)] 73.165 kg (161 lb 4.8 oz) (03/27 0500) Weight change:    Intake/Output from previous day: 03/26 0701 - 03/27 0700 In: 1184.2 [P.O.:360; I.V.:110.8; Blood:713.3] Out: 476 [Urine:475; Stool:1]   General appearance: alert, cooperative and no distress Resp: clear to auscultation bilaterally Cardio: regular rate and rhythm GI: soft, non-tender; bowel sounds normal; no masses,  no organomegaly Extremities: extremities normal, atraumatic, no cyanosis or edema  Lab Results:  Recent Labs  05/06/13 1555 05/07/13 1252  WBC 4.4 4.6  HGB 6.0* 7.8*  HCT 19.4* 24.0*  PLT 95* 91*   BMET  Recent Labs  05/06/13 1555  NA 137  K 3.5*  CL 105  CO2 20  GLUCOSE 94  BUN 27*  CREATININE 1.01  CALCIUM 8.0*   CMET CMP     Component Value Date/Time   NA 137 05/06/2013 1555   K 3.5* 05/06/2013 1555   CL 105 05/06/2013 1555   CO2 20 05/06/2013 1555   GLUCOSE 94 05/06/2013 1555   BUN 27* 05/06/2013 1555   CREATININE 1.01 05/06/2013 1555   CREATININE 1.23 01/25/2013 1033   CALCIUM 8.0* 05/06/2013 1555   PROT 5.9* 05/06/2013 1555   ALBUMIN 2.3* 05/06/2013 1555   AST 14 05/06/2013 1555   ALT 8 05/06/2013 1555   ALKPHOS 47 05/06/2013 1555   BILITOT 0.4 05/06/2013 1555   GFRNONAA 63* 05/06/2013 1555   GFRAA 73* 05/06/2013 1555    CBG (last 3)  No results found for this basename: GLUCAP,  in the last 72 hours  INR RESULTS:   Lab Results  Component Value Date   INR 1.69* 05/07/2013   INR 2.48* 05/06/2013   INR 1.22 02/12/2013     Studies/Results: No results found.  Medications: I have  reviewed the patient's current medications.  Assessment/Plan: #1 GI bleed: currently stable and source of bleeding is uncertain. Agree with plans for CT abdomen and pelvis to evaluate for bleed related to AAA. Will continue IVF and recheck CBC in AM. If CBC is stable and CT is without significant interval change may be able to discharge to home this weekend. He has a planned neurologist outpatient evaluation of gait planned for 3/30. Will add iron to his regimen as he is iron deficient. #2 Protein calorie malnutrition: moderate and will add food supplements   LOS: 1 day   Salam Chesterfield G 05/07/2013, 6:18 PM

## 2013-05-07 NOTE — Progress Notes (Signed)
          Daily Rounding Note  05/07/2013, 8:35 AM  LOS: 1 day   SUBJECTIVE:       Stable overnight. Transfused with 2 units of blood.   OBJECTIVE:         Vital signs in last 24 hours:    Temp:  [97.1 F (36.2 C)-98.6 F (37 C)] 98.2 F (36.8 C) (03/27 0555) Pulse Rate:  [63-84] 75 (03/27 0555) Resp:  [18-19] 18 (03/27 0555) BP: (95-135)/(46-85) 119/63 mmHg (03/27 0555) SpO2:  [96 %-100 %] 96 % (03/27 0555) Weight:  [71.7 kg (158 lb 1.1 oz)-73.165 kg (161 lb 4.8 oz)] 73.165 kg (161 lb 4.8 oz) (03/27 0500) Last BM Date: 05/06/13  Did not reexamine.   Intake/Output from previous day: 03/26 0701 - 03/27 0700 In: 1184.2 [P.O.:360; I.V.:110.8; Blood:713.3] Out: 476 [Urine:475; Stool:1]  Intake/Output this shift:    Lab Results:  Recent Labs  05/06/13 1555  WBC 4.4  HGB 6.0*  HCT 19.4*  PLT 95*   BMET  Recent Labs  05/06/13 1555  NA 137  K 3.5*  CL 105  CO2 20  GLUCOSE 94  BUN 27*  CREATININE 1.01  CALCIUM 8.0*   LFT  Recent Labs  05/06/13 1555  PROT 5.9*  ALBUMIN 2.3*  AST 14  ALT 8  ALKPHOS 47  BILITOT 0.4   PT/INR  Recent Labs  05/06/13 1555  LABPROT 26.0*  INR 2.48*     ASSESMENT:   * Anemia. MCV unknown. Anemia studies, CBC pending.  FOBT + and report of limited black stools a few days ago after taking Bismuth.  S/p 2 PRBCs overnight.  * 3 to 4 weeks of diarrhea. No stool studies. Formed BM today per wife. Taking Imodium at home  * Hx a fib, chronic Coumadin. Currently on hold.  INR last night 2.4.  Received 5 mg po vitamin K.  *  Thrombocytopenia. Dates back to 2012    PLAN   *  EGD today.   *  Will reorder PTINR which was not collected this AM due to transfusing blood    Azucena Freed  05/07/2013, 8:35 AM Pager: [phone]

## 2013-05-07 NOTE — Progress Notes (Signed)
Agree 

## 2013-05-07 NOTE — Progress Notes (Signed)
  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 I have called the patient, his wife and Dr. Ardeth Perfect to let them know. Dr. Oneida Alar had indicated that no intervention would be possible for [name] [name] if there were problems so we have not contacted vascular surgery tonight.  [name] had ?'s about when he could go home and into what conditions. I suggested that we might get hospice to help with that.  Gatha Mayer, MD, [facility] [phone] (pager) 05/07/2013 9:09 PM

## 2013-05-07 NOTE — Care Management Note (Unsigned)
    Page 1 of 1   05/07/2013     5:31:56 PM   CARE MANAGEMENT NOTE 05/07/2013  Patient:  [name] [name],[name] [name]   Account Number:  [id number]  Date Initiated:  05/07/2013  Documentation initiated by:  Kwan Shellhammer  Subjective/Objective Assessment:   PT ADM ON 3/26 WITH ANEMIA.  PTA, PT LIVES AT [place].     Action/Plan:   PT ACTIVE WITH [facility] AND HHPT PRIOR TO ADMISSION.  WILL NEED RESUMPTION OF HH CARE ORDERS PRIOR TO DC.   Anticipated DC Date:  05/09/2013   Anticipated DC Plan:  [place]  CM consult      [facility] Choice  Resumption Of Svcs/PTA Provider   Choice offered to / List presented to:             [place]   Status of service:  In process, will continue to follow Medicare Important Message given?   (If response is "NO", the following Medicare IM given date fields will be blank) Date Medicare IM given:   Date Additional Medicare IM given:    Discharge Disposition:    Per UR Regulation:  Reviewed for med. necessity/level of care/duration of stay  If discussed at [place] of Stay Meetings, dates discussed:    Comments:

## 2013-05-08 LAB — BASIC METABOLIC PANEL
BUN: 17 mg/dL (ref 6–23)
CHLORIDE: 105 meq/L (ref 96–112)
CO2: 18 mEq/L — ABNORMAL LOW (ref 19–32)
Calcium: 8.1 mg/dL — ABNORMAL LOW (ref 8.4–10.5)
Creatinine, Ser: 0.93 mg/dL (ref 0.50–1.35)
GFR calc Af Amer: 83 mL/min — ABNORMAL LOW (ref >90)
GFR, EST NON AFRICAN AMERICAN: 71 mL/min — AB (ref >90)
GLUCOSE: 96 mg/dL (ref 70–99)
Potassium: 4.1 mEq/L (ref 3.7–5.3)
SODIUM: 136 meq/L — AB (ref 137–147)

## 2013-05-08 LAB — TYPE AND SCREEN
ABO/RH(D): O NEG
ANTIBODY SCREEN: NEGATIVE
UNIT DIVISION: 0
Unit division: 0

## 2013-05-08 LAB — CBC
HCT: 27.7 % — ABNORMAL LOW (ref 39.0–52.0)
Hemoglobin: 8.7 g/dL — ABNORMAL LOW (ref 13.0–17.0)
MCH: 29.8 pg (ref 26.0–34.0)
MCHC: 31.4 g/dL (ref 30.0–36.0)
MCV: 94.9 fL (ref 78.0–100.0)
Platelets: 106 10*3/uL — ABNORMAL LOW (ref 150–400)
RBC: 2.92 MIL/uL — ABNORMAL LOW (ref 4.22–5.81)
RDW: 20.8 % — ABNORMAL HIGH (ref 11.5–15.5)
WBC: 5.6 10*3/uL (ref 4.0–10.5)

## 2013-05-08 MED ORDER — PANTOPRAZOLE SODIUM 40 MG PO TBEC: 40.0000 mg | DELAYED_RELEASE_TABLET | Freq: Every day | ORAL | Status: AC

## 2013-05-08 MED ORDER — POLYSACCHARIDE IRON COMPLEX 150 MG PO CAPS: 150.0000 mg | ORAL_CAPSULE | Freq: Every day | ORAL | Status: DC

## 2013-05-08 NOTE — Progress Notes (Signed)
Subjective: Resting comfortably this AM   Objective: Vital signs in last 24 hours: Temp:  [97.6 F (36.4 C)-97.9 F (36.6 C)] 97.8 F (36.6 C) (03/28 0438) Pulse Rate:  [69-74] 72 (03/28 0438) Resp:  [12-26] 18 (03/28 0438) BP: (104-159)/(33-78) 139/58 mmHg (03/28 0438) SpO2:  [99 %-100 %] 99 % (03/28 0438) Weight change:    Intake/Output from previous day: 03/27 0701 - 03/28 0700 In: 640 [P.O.:240; I.V.:400] Out: 601 [Urine:600; Stool:1]   General appearance: alert, cooperative and no distress Resp: clear to auscultation bilaterally Cardio: regular rate and rhythm GI: soft, non-tender; bowel sounds normal; no masses,  no organomegaly Extremities: extremities normal, atraumatic, no cyanosis or edema  Lab Results:  Recent Labs  05/07/13 1252 05/08/13 0405  WBC 4.6 5.6  HGB 7.8* 8.7*  HCT 24.0* 27.7*  PLT 91* 106*   BMET  Recent Labs  05/06/13 1555 05/08/13 0405  NA 137 136*  K 3.5* 4.1  CL 105 105  CO2 20 18*  GLUCOSE 94 96  BUN 27* 17  CREATININE 1.01 0.93  CALCIUM 8.0* 8.1*   CMET CMP     Component Value Date/Time   NA 136* 05/08/2013 0405   K 4.1 05/08/2013 0405   CL 105 05/08/2013 0405   CO2 18* 05/08/2013 0405   GLUCOSE 96 05/08/2013 0405   BUN 17 05/08/2013 0405   CREATININE 0.93 05/08/2013 0405   CREATININE 1.23 01/25/2013 1033   CALCIUM 8.1* 05/08/2013 0405   PROT 5.9* 05/06/2013 1555   ALBUMIN 2.3* 05/06/2013 1555   AST 14 05/06/2013 1555   ALT 8 05/06/2013 1555   ALKPHOS 47 05/06/2013 1555   BILITOT 0.4 05/06/2013 1555   GFRNONAA 71* 05/08/2013 0405   GFRAA 83* 05/08/2013 0405    CBG (last 3)  No results found for this basename: GLUCAP,  in the last 72 hours  INR RESULTS:   Lab Results  Component Value Date   INR 1.69* 05/07/2013   INR 2.48* 05/06/2013   INR 1.22 02/12/2013     Studies/Results: Ct Angio Abd/pel W/ And/or W/o  05/07/2013   CLINICAL DATA:  Low hemoglobin and evaluate for an abdominal aortic aneurysm leak.  EXAM: CT  ANGIOGRAPHY ABDOMEN AND PELVIS  TECHNIQUE: Multidetector CT imaging of the abdomen and pelvis was performed using the standard protocol during bolus administration of intravenous contrast. Multiplanar reconstructed images including MIPs were obtained and reviewed to evaluate the vascular anatomy.  CONTRAST:  100 mL Omnipaque 350  COMPARISON:  01/28/2013  FINDINGS: ARTERIAL FINDINGS:  Aorta: Descending thoracic aorta measures 3.6 cm and unchanged. Patient has a transrenal aortic stent graft. The stent graft has a single lumen that extends into the right external iliac artery. The stent graft is patent. The aneurysm sac has changed in the interim. There is now gas within the aneurysm sac, particularly along the right anterior aspect. There is new high-density fluid around the right and anterior aspect of the sac which could represent blood products. The sac roughly measures 13.3 x 11.3 cm and previously measured 12.7 x 11.4 cm. Again noted are multiple high-density structures throughout the aneurysm sac. Difficult to evaluate for a subtle endoleak because pre-contrast and delayed images were not obtained. There are new high density areas in the left lower abdomen that could also represent blood products. Multiple gas bubbles along the distal duodenum which is directly adjacent to the aneurysm sac. There is no longer a fat plane between the duodenum and aneurysm sac. 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  Celiac axis:         Patent  Superior mesenteric: Calcifications at the origin but patent. There is replaced right hepatic artery.  Left renal:          Patent  Right renal:         Patent  Inferior mesenteric: Not well visualized.  Left iliac: Coil embolization of the left common iliac artery. There is a patent femoral-femoral bypass graft that supplies the proximal left femoral arteries.  Right iliac: Stent extends into the right external iliac artery. The right external iliac artery is patent.  Right internal iliac artery is occluded with coils.  Venous findings: Venous structures not well visualized on this arterial examination.  Review of the MIP images confirms the above findings.  NONVASCULAR FINDINGS:  Patient now has small effusions, right side greater the left. There is edema and fluid surrounding the upper abdomen and distal aspect of the duodenum. This finding is new from the previous examination. There is fluid and edema along the right side of the aortic sac near the inferior vena cava. Small amount of fluid around the liver. No gross abnormality to the pancreas but poorly visualized. A small amount of fluid or edema around the spleen. There is mild mesenteric edema. Small amount of fluid in the pelvis which is new. Prostate is enlarged. Fluid in the urinary bladder. 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  IMPRESSION: Enlargement of the abdominal aortic aneurysm sac with development of intra-sac gas and surrounding fluid and edema. 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 The fluid around the aneurysm sac could represent blood products and the patient has a known aortic endoleak. Endoleak is not well demonstrated on this single phase of imaging.  Small amount of edema and fluid throughout the abdomen and pelvis.  Development of bilateral pleural effusions.  Critical Value/emergent results were called by telephone at the time of interpretation on 05/07/2013 at 8:03 PM to Dr. Silvano Rusk , who verbally acknowledged these results.   Electronically Signed   By: Markus Daft M.D.   On: 05/07/2013 20:10    Medications: I have reviewed the patient's current medications.  Assessment/Plan: GI bleed - Dr Carlean Purl and Fields on board and appreciate their recs. EGD normal. 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 Per prior notes from vascular, this is likely inoperable at this point, but have placed a consult  to Dr Oneida Alar today for his input on any potential options. I have stopped the coumadin indefinitely and pt is now on PPI. Palliative care consult  Placed this AM to discuss [facility] if this is inoperable case. Hgb stable this AM. Check daily CBC. On no blood thinners. Plt ~ 100. Wife aware   #2 Protein calorie malnutrition: on food supplements   PPx - SCDs     LOS: 2 days   [name] [name] 05/08/2013, 6:49 AM

## 2013-05-08 NOTE — Progress Notes (Signed)
Pt d/c home. A&O x4. No c/o pain. Education given to pt on diet, activity, meds, and follow-up appts and care.  Pt and family verbalized understanding.  IV d/c.

## 2013-05-08 NOTE — Discharge Summary (Signed)
Physician Discharge Summary    Alarik Radu  MR#: [id number]  DOB:1919/06/13  Date of Admission: 05/06/2013 Date of Discharge: 05/08/2013  Attending Physician:Sibel Khurana  Patient's TIR:[id number] G, MD  Consults:Treatment Team:  Elam Dutch, MD Dr Carlean Purl   Discharge Diagnoses: Principal Problem:   Anemia   Aorto-ENteric fistula Active Problems:   Essential hypertension, benign   Atrial fibrillation   AAA (abdominal aortic aneurysm)   Melena   Gait instability   Discharge Medications:   Medication List    STOP taking these medications       warfarin 1 MG tablet  Commonly known as:  COUMADIN     warfarin 2 MG tablet  Commonly known as:  COUMADIN      TAKE these medications       allopurinol 300 MG tablet  Commonly known as:  ZYLOPRIM  Take 300 mg by mouth daily.     CALCIUM PO  Take 1 tablet by mouth daily.     finasteride 5 MG tablet  Commonly known as:  PROSCAR  Take 5 mg by mouth daily.     fosinopril 40 MG tablet  Commonly known as:  MONOPRIL  Take 20 mg by mouth daily.     iron polysaccharides 150 MG capsule  Commonly known as:  NIFEREX  Take 1 capsule (150 mg total) by mouth daily.     metoprolol 50 MG tablet  Commonly known as:  LOPRESSOR  Take 25 mg by mouth 2 (two) times daily.     pantoprazole 40 MG tablet  Commonly known as:  PROTONIX  Take 1 tablet (40 mg total) by mouth daily at 6 (six) AM.     simvastatin 20 MG tablet  Commonly known as:  ZOCOR  Take 20 mg by mouth at bedtime.        Hospital Procedures: Ct Angio Abd/pel W/ And/or W/o  05/07/2013   CLINICAL DATA:  Low hemoglobin and evaluate for an abdominal aortic aneurysm leak.  EXAM: CT ANGIOGRAPHY ABDOMEN AND PELVIS  TECHNIQUE: Multidetector CT imaging of the abdomen and pelvis was performed using the standard protocol during bolus administration of intravenous contrast. Multiplanar reconstructed images including MIPs were obtained and reviewed to  evaluate the vascular anatomy.  CONTRAST:  100 mL Omnipaque 350  COMPARISON:  01/28/2013  FINDINGS: ARTERIAL FINDINGS:  Aorta: Descending thoracic aorta measures 3.6 cm and unchanged. Patient has a transrenal aortic stent graft. The stent graft has a single lumen that extends into the right external iliac artery. The stent graft is patent. The aneurysm sac has changed in the interim. There is now gas within the aneurysm sac, particularly along the right anterior aspect. There is new high-density fluid around the right and anterior aspect of the sac which could represent blood products. The sac roughly measures 13.3 x 11.3 cm and previously measured 12.7 x 11.4 cm. Again noted are multiple high-density structures throughout the aneurysm sac. Difficult to evaluate for a subtle endoleak because pre-contrast and delayed images were not obtained. There are new high density areas in the left lower abdomen that could also represent blood products. Multiple gas bubbles along the distal duodenum which is directly adjacent to the aneurysm sac. There is no longer a fat plane between the duodenum and aneurysm sac. 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  Celiac axis:         Patent  Superior mesenteric: Calcifications at the origin but patent. There is replaced right hepatic artery.  Left renal:  Patent  Right renal:         Patent  Inferior mesenteric: Not well visualized.  Left iliac: Coil embolization of the left common iliac artery. There is a patent femoral-femoral bypass graft that supplies the proximal left femoral arteries.  Right iliac: Stent extends into the right external iliac artery. The right external iliac artery is patent. Right internal iliac artery is occluded with coils.  Venous findings: Venous structures not well visualized on this arterial examination.  Review of the MIP images confirms the above findings.  NONVASCULAR FINDINGS:  Patient now has small effusions, right side greater  the left. There is edema and fluid surrounding the upper abdomen and distal aspect of the duodenum. This finding is new from the previous examination. There is fluid and edema along the right side of the aortic sac near the inferior vena cava. Small amount of fluid around the liver. No gross abnormality to the pancreas but poorly visualized. A small amount of fluid or edema around the spleen. There is mild mesenteric edema. Small amount of fluid in the pelvis which is new. Prostate is enlarged. Fluid in the urinary bladder. 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  IMPRESSION: Enlargement of the abdominal aortic aneurysm sac with development of intra-sac gas and surrounding fluid and edema. 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 The fluid around the aneurysm sac could represent blood products and the patient has a known aortic endoleak. Endoleak is not well demonstrated on this single phase of imaging.  Small amount of edema and fluid throughout the abdomen and pelvis.  Development of bilateral pleural effusions.  Critical Value/emergent results were called by telephone at the time of interpretation on 05/07/2013 at 8:03 PM to Dr. Silvano Rusk , who verbally acknowledged these results.   Electronically Signed   By: Markus Daft M.D.   On: 05/07/2013 20:10    History of Present Illness: The patient is a 80 year old Caucasian man who is well known to me. He has several medical problems as described below, and lately has had persistent loose bowel movements for approximate 4 weeks. In the past 2 days he's had black bowel movements associated with taking Kaopectate. He has felt increasingly more weak over the past week. He has not had bright red blood per rectum. His appetite has been reduced so showed mild nausea, no vomiting or significant abdominal pain. He is on chronic anticoagulation because of atrial fibrillation. A few years  ago he had a significant lower GI bleed from hemorrhoidectomy while on Pradaxa. He is not having significant abdominal pain, dyspnea, or chest discomfort. He and his wife emphasizes that he would like to continue his DO NOT RESUSCITATE order while in the hospital.       Hospital Course: Acute blood loss anemia - Hgb responded appropriately to 2U PRBC. Dr Carlean Purl performed unremarkable EGD. CTA A/P noted an aortoenteric fistula that was the likely source of his bleed and anemia. Dr Oneida Alar of [facility] was consulted and stated this fistula is inoperable given his current state and medical history. The family is on board and understands that this ultimately has high probability of being fatal when it rebleeds, and they are wishing to be discharged home and live the remainder of his life to the fullest. His coumadin for a fib has been stopped and will not be resumed given the risk of fatal bleed outways the benefit of preventing CE CVA. He remains  DO NOT RESUSCITATE. His hgb was > 8, VSS, no abd pain, BMs are currently w/o blood, and he is tolerating food w/o difficulty. He is safe for discharge w/ close f/u w/ Dr Philip Aspen next week   A fib - he remained rate controlled in house on home meds. His coumadin has been stopped indefinitely 2/2 above reasons.  He will be discharged to his home w/ his wife.   Day of Discharge Exam BP 139/58  Pulse 72  Temp(Src) 97.8 F (36.6 C) (Oral)  Resp 18  Ht 5\' 9"  (1.753 m)  Wt 73.165 kg (161 lb 4.8 oz)  BMI 23.81 kg/m2  SpO2 99%  Physical Exam: General appearance: alert, cooperative and no distress  Resp: clear to auscultation bilaterally  Cardio: regular rate and rhythm  GI: soft, non-tender; bowel sounds normal; no masses, no organomegaly  Extremities: extremities normal, atraumatic, no cyanosis or edema      Discharge Labs:  Recent Labs  05/06/13 1555 05/08/13 0405  NA 137 136*  K 3.5* 4.1  CL 105 105  CO2 20 18*  GLUCOSE 94 96  BUN 27* 17   CREATININE 1.01 0.93  CALCIUM 8.0* 8.1*    Recent Labs  05/06/13 1555  AST 14  ALT 8  ALKPHOS 47  BILITOT 0.4  PROT 5.9*  ALBUMIN 2.3*    Recent Labs  05/07/13 1252 05/08/13 0405  WBC 4.6 5.6  HGB 7.8* 8.7*  HCT 24.0* 27.7*  MCV 93.8 94.9  PLT 91* 106*   Lab Results  Component Value Date   INR 1.69* 05/07/2013   INR 2.48* 05/06/2013   INR 1.22 02/12/2013   No results found for this basename: CKTOTAL, CKMB, CKMBINDEX, TROPONINI,  in the last 72 hours No results found for this basename: TSH, T4TOTAL, FREET3, T3FREE, THYROIDAB,  in the last 72 hours  Recent Labs  05/06/13 1555  VITAMINB12 517  FOLATE 5.9  FERRITIN 336*  TIBC 159*  IRON 16*  RETICCTPCT 3.9*    Discharge instructions:     Discharge Orders   Future Appointments Provider Department Dept Phone   05/10/2013 2:00 PM Kathrynn Ducking, MD [facility] [phone]   05/14/2013 8:30 AM [facility] [place] [facility] [phone]   06/24/2013 10:00 AM [facility] AT [place] [phone]   Liquids only 4 hours prior to your exam. Any medications can be taken as usual. Please arrive 15 min prior to your scheduled exam time.   06/24/2013 11:45 AM Elam Dutch, MD [facility] [place] [phone]   Future Orders Complete By Expires   Call MD for:  As directed    Scheduling Instructions:     Bloody stool   Diet - low sodium heart healthy  As directed    Discharge instructions  As directed    Comments:     Given your bleeding risk, stop your coumadin until told otherwise by a physician   Increase activity slowly  As directed      01-Home or Self Care   Disposition: home   Follow-up Appts: Follow-up with Dr. Philip Aspen at [facility] in 2-3 days. Office will Call for appointment.  Condition on Discharge: stable   Tests Needing Follow-up: CBC  Time spent in discharge (includes  decision making & examination of pt): 45 minutes    Signed: Lyrik Buresh 05/08/2013, 10:19 AM

## 2013-05-08 NOTE — Consult Note (Signed)
[facility] [place] CONSULT NOTE   MRN : [id number]  Reason for Consult: GI bleed, anemia.  S/P EVAR 2012 Referring Physician: Donnajean Lopes   History of Present Illness: 80 y/o male with GI bleeding and  increased weakness.  He has had persistent loose bowel movements over the past 4 weeks.   He has a history of A fib and is on coumadin.  Dr. Oneida Alar performed AAA repair EVAR in 2012.  We have been consulted to review the AAA.  He underwent coil embolization of the left internal iliac artery on January 2. This was done for progressive enlargement of his abdominal aortic aneurysm after prior stent graft repair.  Dr. Oneida Alar plan from his visit 04/01/2013:  we will obtain a CT Angio the abdomen and pelvis in 3 months to see if we have any improvement in the appearance of his aneurysm after coil embolization of his internal iliac  A CT scan was ordered 05/07/2013 we will review it and make recommendations.  He reports no N/V/ and has had a full breakfast without stomach pain this am.        Current Facility-Administered Medications  Medication Dose Route Frequency Provider Last Rate Last Dose  . 0.9 % NaCl with KCl 20 mEq/ L  infusion   Intravenous Continuous Leanna Battles, MD      . acetaminophen (TYLENOL) tablet 650 mg  650 mg Oral Q6H PRN Leanna Battles, MD       Or  . acetaminophen (TYLENOL) suppository 650 mg  650 mg Rectal Q6H PRN Leanna Battles, MD      . allopurinol (ZYLOPRIM) tablet 300 mg  300 mg Oral Daily Leanna Battles, MD   300 mg at 05/07/13 1022  . alum & mag hydroxide-simeth (MAALOX/MYLANTA) 200-200-20 MG/5ML suspension 30 mL  30 mL Oral Q6H PRN Leanna Battles, MD      . finasteride (PROSCAR) tablet 5 mg  5 mg Oral Daily Leanna Battles, MD   5 mg at 05/07/13 1022  . iron polysaccharides (NIFEREX) capsule 150 mg  150 mg Oral Daily Leanna Battles, MD   150 mg at 05/07/13 2127  . metoprolol tartrate (LOPRESSOR) tablet 25 mg  25 mg Oral BID Leanna Battles, MD   25 mg at 05/07/13 2253  . oxyCODONE (Oxy IR/ROXICODONE) immediate release tablet 5-10 mg  5-10 mg Oral Q4H PRN Leanna Battles, MD      . pantoprazole (PROTONIX) EC tablet 40 mg  40 mg Oral Q0600 Vena Rua, PA-C   40 mg at 05/08/13 0553  . promethazine (PHENERGAN) tablet 12.5 mg  12.5 mg Oral Q6H PRN Leanna Battles, MD      . simvastatin (ZOCOR) tablet 20 mg  20 mg Oral QHS Leanna Battles, MD   20 mg at 05/07/13 2253  . sodium chloride 0.9 % injection 3 mL  3 mL Intravenous Q12H Leanna Battles, MD   3 mL at 05/07/13 1022    Pt meds include: Statin :Yes Betablocker: Yes ASA: No Other anticoagulants/antiplatelets: was on coumadin prior to this admission.  Past Medical History  Diagnosis Date  . Atrial fibrillation     paroxysmal  . Atrial flutter   . Unspecified essential hypertension   . Anal fissure   . Hyperlipidemia   . PVD (peripheral vascular disease)     s/p fem-fem bypass grafting 2006  . AAA (abdominal aortic aneurysm)     s/p graft repair 2000  . SSS (sick sinus syndrome)  s/p Medtronic PPM implantation 07/2008  . Mild claudication   . Pacemaker   . Anemia   . History of blood transfusion     "bleeding out rectum" (02/12/2013)  . Gout   . Arthritis     "touches"  . Basal cell carcinoma of head     "4 times"  . Dysrhythmia     Past Surgical History  Procedure Laterality Date  . Skin cancer excision      4 times "off my head; they were not melamonas" (3/262015"  . Cataract extraction w/ intraocular lens  implant, bilateral Bilateral   . Tonsillectomy    . Abdominal aortic aneurysm repair    . Aortic stent graft    . US echocardiography  07/15/2008    EF 55-60%  . Endovascular stent insertion  01/09/2011    Procedure: ENDOVASCULAR STENT GRAFT INSERTION;  Surgeon: Elam Dutch, MD;  Location: [facility];  Service: Vascular;  Laterality: Right;  With Left  Brachial artery cut-down  . Abdominal aortagram  02/12/2013    embolism left internal  iliac artery  . Insert / replace / remove pacemaker  07/19/2008;10/23/2012    MDT Enrhythm, subsequently changed by Dr Rayann Heman due to premature battery failure;generator change, MDT Sherril Croon by Dr Rayann Heman  . Pacemaker generator change  10/23/2012     MDT Sherril Croon by Dr Rayann Heman  . Anal fissure repair  ~ 1952  . Inguinal hernia repair Right   . Hemorrhoid banding      Social History History  Substance Use Topics  . Smoking status: Former Smoker -- 1.00 packs/day for 32 years    Types: Cigarettes    Quit date: 02/12/1971  . Smokeless tobacco: Never Used  . Alcohol Use: 0.0 oz/week     Comment: 05/06/2013 "not drinking much alcohol at all now"    Family History Family History  Problem Relation Age of Onset  . Breast cancer Daughter     granddaughter  . Cancer Daughter   . Diabetes Mother   . Liver disease Mother   . AAA (abdominal aortic aneurysm) Son       REVIEW OF SYSTEMS  General: [ ]  Weight loss, [ ]  Fever, [ ]  chills Neurologic: [ ]  Dizziness, [ ]  Blackouts, [ ]  Seizure [ ]  Stroke, [ ]  "Mini stroke", [ ]  Slurred speech, [ ]  Temporary blindness; [ ]  weakness in arms or legs, [ ]  Hoarseness [ ]  Dysphagia Cardiac: [ ]  Chest pain/pressure, [ ]  Shortness of breath at rest [ ]  Shortness of breath with exertion, [x ] Atrial fibrillation or irregular heartbeat  Vascular: [ ]  Pain in legs with walking, [ ]  Pain in legs at rest, [ ]  Pain in legs at night,  [ ]  Non-healing ulcer, [ ]  Blood clot in vein/DVT,   Pulmonary: [ ]  Home oxygen, [ ]  Productive cough, [ ]  Coughing up blood, [ ]  Asthma,  [ ]  Wheezing [ ]  COPD Musculoskeletal:  [x ] Arthritis, [ ]  Low back pain, [ ]  Joint pain Hematologic: [ ]  Easy Bruising, [x ] Anemia; [ ]  Hepatitis Gastrointestinal: [ x] Blood in stool, [ ]  Gastroesophageal Reflux/heartburn, Urinary: [ ]  chronic Kidney disease, [ ]  on HD - [ ]  MWF or [ ]  TTHS, [ ]  Burning with urination, [ ]  Difficulty urinating Skin: [ ]  Rashes, [ ]  Wounds Psychological: [ ]   Anxiety, [ ]  Depression  Physical Examination Filed Vitals:   05/07/13 1529 05/07/13 1553 05/07/13 2008 05/08/13 0438  BP: 104/47 125/61  132/70 139/58  Pulse: 70 70 74 72  Temp: 97.6 F (36.4 C) 97.6 F (36.4 C) 97.9 F (36.6 C) 97.8 F (36.6 C)  TempSrc:  Oral Oral Oral  Resp: 26 18 20 18   Height:      Weight:      SpO2: 99% 100% 100% 99%   Body mass index is 23.81 kg/(m^2).  General:  WDWN in NAD HENT: WNL Eyes: Pupils equal Pulmonary: normal non-labored breathing , without Rales, rhonchi,  wheezing Cardiac: RRR, without  Murmurs, rubs or gallops; No carotid bruits Abdomen: soft, NT, no masses, Positive BS Skin: no rashes, ulcers noted;  no Gangrene , no cellulitis; no open wounds;   Vascular Exam/Pulses: Palpable femoral, popliteal pulses feet are warm and with min-mod  edema.   Musculoskeletal: no muscle wasting or atrophy;  Neurologic: A&O X 3; Appropriate Affect ;  SENSATION: normal; MOTOR FUNCTION: 5/5 Symmetric Speech is fluent/normal   Significant Diagnostic Studies: CBC Lab Results  Component Value Date   WBC 5.6 05/08/2013   HGB 8.7* 05/08/2013   HCT 27.7* 05/08/2013   MCV 94.9 05/08/2013   PLT 106* 05/08/2013    BMET    Component Value Date/Time   NA 136* 05/08/2013 0405   K 4.1 05/08/2013 0405   CL 105 05/08/2013 0405   CO2 18* 05/08/2013 0405   GLUCOSE 96 05/08/2013 0405   BUN 17 05/08/2013 0405   CREATININE 0.93 05/08/2013 0405   CREATININE 1.23 01/25/2013 1033   CALCIUM 8.1* 05/08/2013 0405   GFRNONAA 71* 05/08/2013 0405   GFRAA 83* 05/08/2013 0405   Estimated Creatinine Clearance: 51.7 ml/min (by C-G formula based on Cr of 0.93).  COAG Lab Results  Component Value Date   INR 1.69* 05/07/2013   INR 2.48* 05/06/2013   INR 1.22 02/12/2013   CT Angio: 04/09/2013 The  stent graft is patent. The aneurysm sac has changed in the interim.  There is now gas within the aneurysm sac, particularly along the  right anterior aspect. There is new  high-density fluid around the  right and anterior aspect of the sac which could represent blood  products. The sac roughly measures 13.3 x 11.3 cm and previously  measured 12.7 x 11.4 cm. Again noted are multiple high-density  structures throughout the aneurysm sac. Difficult to evaluate for a  subtle endoleak because pre-contrast and delayed images were not  obtained. There are new high density areas in the left lower abdomen  that could also represent blood products. Multiple gas bubbles along  the distal duodenum which is directly adjacent to the aneurysm sac.  There is no longer a fat plane between the duodenum and aneurysm  sac. 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    ASSESSMENT/PLAN:  Increased size of aneurysm sac right anterior S/P endovascular stent graft repair of abdominal aortic aneurysm 2012 and Abdominal aortogram, coil embolization of left internal iliac artery 02/22/2013.  HGB 8.7 which has increased from 05/07/2013 after 2 units PRBC CTA suggests aortic-enteric fistula. I will review the findings with Dr. Oneida Alar He and his wife want to go home as soon as possible.  Laurence Slate [facility] 05/08/2013 8:15 AM  Patient is a 80 year old male whom I first met 2 years ago. He had initially treatment of in infrarenal abdominal aortic aneurysm with an Ancure graft in [facility] on 11/06/1998. He subsequently had treatment in [place] which with what sounds like coiling of what was described as a type 1 endoleak. I do not have  any records of that procedure. He subsequently underwent an extensive operation which he reports took 9 hours at [facility] in August 2006. At that time he had his graft converted to an aortouniiliac and had a fem-fem bypass. Apparently had a type 1 endo leak from the right limb of his aorta femoral Endograft. Following that surgery in 2006 he had extensive treatment in [place] with wound problems from his fem-fem bypass, but  subsequently healed these. His CT scan in August 2008 showing maximal size of his aneurysm sac at 8.5 cm. He then presented with a ruptured 10 cm infrarenal aneurysm in 2012. This was repaired with a 36 x 116 Cook Renu graft and a 36 x 50 proximal cuff. He was seen in December 2013 at which time the aneurysm was 11 cm in diameter but no obvious endoleak seen. He was admitted yesterday with lethargy and failure to thrive and found to be anemic.  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  He has had some recent diarrhea but denies noticing blood in his stool.  He denies any abdominal or back pain.  He was on Coumadin for atrial fibrillation but this has been stopped.  PE:  Filed Vitals:   05/07/13 1529 05/07/13 1553 05/07/13 2008 05/08/13 0438  BP: 104/47 125/61 132/70 139/58  Pulse: 70 70 74 72  Temp: 97.6 F (36.4 C) 97.6 F (36.4 C) 97.9 F (36.6 C) 97.8 F (36.6 C)  TempSrc:  Oral Oral Oral  Resp: 26 18 20 18   Height:      Weight:      SpO2: 99% 100% 100% 99%    Abdomen: soft non tender non distended no pulsatile mass but mass of aneurysm palpable Extremities: 2+ femoral pulses  Data: I have reviewed his CT angio there is air either intra or extraluminal 3/4 portion of duodenum without extrav of contrast and some air within the aneurysm sac.  There do not appear to be any stent struts fractured or outside the aneurysm.  Most likely erosion of the native aneurysm.  The aortic diameter at the diaphragm is 3.5 cm.  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  Repair of this would require thoracoabdominal aneurysm repair and replacement of his aorta from at least the mid descending thoracic aorta to the aortic bifurcation.  I do not believe he would tolerate this operation and if he did survive his quality of life would be severely diminished with high likelihood of ventilator dependence renal failure etc. In  this 80 year old.  P:  In light of the above would agree with palliative care consult.  I would not put him under any restrictions and I have advised him to do things he likes since this most likely will end in life ending hemorrhage.  He overall feels well today and would like to be be discharged to home.  I have discussed this with Dr Ardeth Perfect.  Pt has follow up scheduled with me in the future if he heals this area.  Ruta Hinds, MD [facility] Office: [phone] Pager: [phone]

## 2013-05-10 ENCOUNTER — Encounter (HOSPITAL_COMMUNITY): Payer: Self-pay | Admitting: Internal Medicine

## 2013-05-10 ENCOUNTER — Ambulatory Visit (INDEPENDENT_AMBULATORY_CARE_PROVIDER_SITE_OTHER): Payer: Medicare Other | Admitting: Neurology

## 2013-05-10 VITALS — BP 142/72 | HR 70 | Ht 65.0 in | Wt 162.0 lb

## 2013-05-10 DIAGNOSIS — R269 Unspecified abnormalities of gait and mobility: Secondary | ICD-10-CM

## 2013-05-10 DIAGNOSIS — R2681 Unsteadiness on feet: Secondary | ICD-10-CM

## 2013-05-10 NOTE — Patient Instructions (Signed)

## 2013-05-10 NOTE — Progress Notes (Signed)
Reason for visit: Gait disorder  [name] [name] is a 80 y.o. male  History of present illness:  [name] [name] is a 80 year old right-handed white male with a history of problems with relatively sudden onset of hearing deficits in the right ear associated with some nausea and dizziness. This occurred in 2003. The patient underwent MRI evaluation of the brain that showed evidence of a tumor that was the cause of the problems, but he was told that he would have to "live with it". The patient has had some gradual change in his balance, but this has significantly worsened over the last 18 months. The patient had a repeat MRI done in 2008, and was told that the tumor had not grown much from the prior study. The patient reports no focal numbness or weakness of the face, arms, or legs. The patient does have some occasional double vision and slurred speech. The patient has not had any blackout episodes. The patient denies problems controlling the bowels or the bladder, but he does have some slight memory issues. 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 The patient is sent to this office for an evaluation. The patient has a pacemaker in place.  Past Medical History  Diagnosis Date  . Atrial fibrillation     paroxysmal  . Atrial flutter   . Unspecified essential hypertension   . Anal fissure   . Hyperlipidemia   . PVD (peripheral vascular disease)     s/p fem-fem bypass grafting 2006  . AAA (abdominal aortic aneurysm)     s/p graft repair 2000  . SSS (sick sinus syndrome)     s/p Medtronic PPM implantation 07/2008  . Mild claudication   . Pacemaker   . Anemia   . History of blood transfusion     "bleeding out rectum" (02/12/2013)  . Gout   . Arthritis     "touches"  . Basal cell carcinoma of head     "4 times"  . Dysrhythmia   . HOH (hard of hearing)   . Broken arm     left 1961    Past Surgical History  Procedure Laterality Date    . Skin cancer excision      4 times "off my head; they were not melamonas" (3/262015"  . Cataract extraction w/ intraocular lens  implant, bilateral Bilateral   . Tonsillectomy    . Abdominal aortic aneurysm repair    . Aortic stent graft    . US echocardiography  07/15/2008    EF 55-60%  . Endovascular stent insertion  01/09/2011    Procedure: ENDOVASCULAR STENT GRAFT INSERTION;  Surgeon: Elam Dutch, MD;  Location: [facility];  Service: Vascular;  Laterality: Right;  With Left  Brachial artery cut-down  . Abdominal aortagram  02/12/2013    embolism left internal iliac artery  . Insert / replace / remove pacemaker  07/19/2008;10/23/2012    MDT Enrhythm, subsequently changed by Dr Rayann Heman due to premature battery failure;generator change, MDT Sherril Croon by Dr Rayann Heman  . Pacemaker generator change  10/23/2012     MDT Sherril Croon by Dr Rayann Heman  . Anal fissure repair  ~ 1952  . Inguinal hernia repair Right   . Hemorrhoid banding    . Esophagogastroduodenoscopy N/A 05/07/2013    Procedure: ESOPHAGOGASTRODUODENOSCOPY (EGD);  Surgeon: Gatha Mayer, MD;  Location: [facility];  Service: Endoscopy;  Laterality: N/A;    Family History  Problem Relation Age of Onset  . Breast cancer Daughter     granddaughter  . Cancer Daughter   . Diabetes Mother   . Liver disease Mother   . AAA (abdominal aortic aneurysm) Son   . Dementia Father     Social history:  reports that he quit smoking about 42 years ago. His smoking use included Cigarettes. He has a 32 pack-year smoking history. He has never used smokeless tobacco. He reports that he does not drink alcohol or use illicit drugs.  Medications:  Current Outpatient Prescriptions on File Prior to Visit  Medication Sig Dispense Refill  . allopurinol (ZYLOPRIM) 300 MG tablet Take 300 mg by mouth daily.       Marland Kitchen CALCIUM PO Take 1 tablet by mouth daily.      . finasteride (PROSCAR) 5 MG tablet Take 5 mg by mouth daily.       . fosinopril (MONOPRIL) 40 MG tablet  Take 20 mg by mouth daily.       . iron polysaccharides (NIFEREX) 150 MG capsule Take 1 capsule (150 mg total) by mouth daily.      . metoprolol (LOPRESSOR) 50 MG tablet Take 25 mg by mouth 2 (two) times daily.       . pantoprazole (PROTONIX) 40 MG tablet Take 1 tablet (40 mg total) by mouth daily at 6 (six) AM.      . simvastatin (ZOCOR) 20 MG tablet Take 20 mg by mouth at bedtime.        No current facility-administered medications on file prior to visit.     No Known Allergies  ROS:  Out of a complete 14 system review of symptoms, the patient complains only of the following symptoms, and all other reviewed systems are negative.  Swelling the legs Shortness of breath Incontinence of bowel, diarrhea  Blood pressure 142/72, pulse 70, height 5\' 5"  (1.651 m), weight 162 lb (73.483 kg).  Physical Exam  General: The patient is alert and cooperative at the time of the examination.  Eyes: Pupils are equal, round, and reactive to light. Discs are flat bilaterally.  Neck: The neck is supple, no carotid bruits are noted.  Respiratory: The respiratory examination is clear.  Cardiovascular: The cardiovascular examination reveals a regular rate and rhythm, no obvious murmurs or rubs are noted.  Skin: Extremities are with 3+ edema below the knees bilaterally.  Neurologic Exam  Mental status: The patient is alert and oriented x 3 at the time of the examination. The patient has apparent normal recent and remote memory, with an apparently normal attention span and concentration ability. The patient is hard of hearing.  Cranial nerves: Facial symmetry is not present. There is some depression of the left nasolabial fold. There is good sensation of the face to pinprick and soft touch bilaterally. The strength of the facial muscles and the muscles to head turning and shoulder shrug are normal bilaterally. Speech is well enunciated, no aphasia or dysarthria is noted. Extraocular movements are full.  Visual fields are full. The tongue is midline, and the patient has symmetric elevation of the soft palate. No obvious hearing deficits are noted.  Motor: The motor testing reveals 5 over 5 strength of all 4 extremities. Good symmetric motor tone is noted throughout.  Sensory: Sensory testing is intact to pinprick, soft touch, vibration sensation, and position sense on all 4 extremities, with excessive there is slight decrease in position sense in both feet.. No evidence of extinction is noted.  Coordination:  Cerebellar testing reveals good finger-nose-finger bilaterally. The patient has apraxia with heel-to-shin bilaterally.  Gait and station: Gait is wide-based, very unsteady. The patient requires assistance for walking. Tandem gait was not attempted. The patient uses a walker for ambulation. Romberg is negative. No drift is seen.  Reflexes: Deep tendon reflexes are symmetric and normal bilaterally. Toes are downgoing bilaterally.   Assessment/Plan:  1. Gait disturbance  2. Abdominal aortic aneurysm, inoperable  The patient has been told that he has some sort of a benign tumor that has affected his hearing in his right ear, and has affected his balance. The patient could potentially have an acoustic neuroma, schwannoma, or meningioma in this region. The patient will undergo a CT scan of the brain with and without contrast. The patient could potentially be a candidate for a gamma knife procedure to shrink the tumor. The patient otherwise is not a surgical candidate. The patient has a large inoperable abdominal aortic aneurysm as well.  Jill Alexanders MD 05/10/2013 8:25 PM  [facility] [address] [place], [place] [address]  Phone [phone] Fax [phone]

## 2013-05-11 LAB — VITAMIN B12: Vitamin B-12: 574 pg/mL (ref 211–946)

## 2013-05-11 LAB — COPPER, SERUM: COPPER: 154 ug/dL (ref 72–166)

## 2013-05-11 NOTE — Progress Notes (Signed)
Quick Note:  Called and shared unremarkable blood work with patient, he verbalized understanding ______

## 2013-05-14 ENCOUNTER — Ambulatory Visit (INDEPENDENT_AMBULATORY_CARE_PROVIDER_SITE_OTHER): Payer: Medicare Other | Admitting: *Deleted

## 2013-05-14 ENCOUNTER — Encounter: Payer: Self-pay | Admitting: Internal Medicine

## 2013-05-14 DIAGNOSIS — Z950 Presence of cardiac pacemaker: Secondary | ICD-10-CM

## 2013-05-14 DIAGNOSIS — I495 Sick sinus syndrome: Secondary | ICD-10-CM

## 2013-05-18 LAB — MDC_IDC_ENUM_SESS_TYPE_REMOTE
Battery Impedance: 100 Ohm
Brady Statistic AS VS Percent: 42 %
Date Time Interrogation Session: 20150403172814
Lead Channel Impedance Value: 410 Ohm
Lead Channel Impedance Value: 446 Ohm
Lead Channel Pacing Threshold Amplitude: 0.5 V
Lead Channel Pacing Threshold Pulse Width: 0.4 ms
Lead Channel Setting Pacing Amplitude: 2 V
Lead Channel Setting Pacing Amplitude: 2.5 V
Lead Channel Setting Sensing Sensitivity: 2 mV
MDC IDC MSMT BATTERY REMAINING LONGEVITY: 135 mo
MDC IDC MSMT BATTERY VOLTAGE: 2.8 V
MDC IDC MSMT LEADCHNL RA PACING THRESHOLD AMPLITUDE: 0.375 V
MDC IDC MSMT LEADCHNL RA PACING THRESHOLD PULSEWIDTH: 0.4 ms
MDC IDC MSMT LEADCHNL RA SENSING INTR AMPL: 2.8 mV
MDC IDC MSMT LEADCHNL RV SENSING INTR AMPL: 16 mV
MDC IDC SET LEADCHNL RV PACING PULSEWIDTH: 0.4 ms
MDC IDC STAT BRADY AP VP PERCENT: 15 %
MDC IDC STAT BRADY AP VS PERCENT: 40 %
MDC IDC STAT BRADY AS VP PERCENT: 2 %

## 2013-05-19 ENCOUNTER — Telehealth: Payer: Self-pay | Admitting: Neurology

## 2013-05-19 ENCOUNTER — Ambulatory Visit
Admission: RE | Admit: 2013-05-19 | Discharge: 2013-05-19 | Disposition: A | Discharge status: Home / Self Care | Payer: Medicare Other | Source: Ambulatory Visit | Attending: Neurology | Admitting: Neurology

## 2013-05-19 DIAGNOSIS — R269 Unspecified abnormalities of gait and mobility: Secondary | ICD-10-CM

## 2013-05-19 DIAGNOSIS — R2681 Unsteadiness on feet: Secondary | ICD-10-CM

## 2013-05-19 MED ORDER — IOHEXOL 300 MG/ML  SOLN
75.0000 mL | Freq: Once | INTRAMUSCULAR | Status: AC | PRN
  Administered 2013-05-19: 75 mL via INTRAVENOUS

## 2013-05-19 NOTE — Telephone Encounter (Signed)
I called patient. The CT scan of brain showed mild small vessel disease. No evidence of a "brain tumor" that the patient described. No significant stroke events.   CT head 05/19/2013:  Impression   Abnormal CT scan of the head showing mild changes of chronic  microscopic ischemia and generalized cerebral atrophy. No enhancing  lesions are noted.

## 2013-05-26 ENCOUNTER — Encounter: Payer: Self-pay | Admitting: *Deleted

## 2013-06-13 ENCOUNTER — Encounter (HOSPITAL_COMMUNITY): Payer: Self-pay | Admitting: Emergency Medicine

## 2013-06-13 ENCOUNTER — Inpatient Hospital Stay (HOSPITAL_COMMUNITY)
Admission: EM | Admit: 2013-06-13 | Discharge: 2013-06-17 | DRG: 299 | Disposition: A | Discharge status: Hospice | Payer: Medicare Other | Attending: Internal Medicine | Admitting: Internal Medicine

## 2013-06-13 ENCOUNTER — Emergency Department (HOSPITAL_COMMUNITY): Payer: Medicare Other

## 2013-06-13 DIAGNOSIS — E785 Hyperlipidemia, unspecified: Secondary | ICD-10-CM | POA: Diagnosis present

## 2013-06-13 DIAGNOSIS — I772 Rupture of artery: Principal | ICD-10-CM | POA: Diagnosis present

## 2013-06-13 DIAGNOSIS — I714 Abdominal aortic aneurysm, without rupture, unspecified: Secondary | ICD-10-CM

## 2013-06-13 DIAGNOSIS — R578 Other shock: Secondary | ICD-10-CM | POA: Diagnosis present

## 2013-06-13 DIAGNOSIS — M109 Gout, unspecified: Secondary | ICD-10-CM | POA: Diagnosis present

## 2013-06-13 DIAGNOSIS — N289 Disorder of kidney and ureter, unspecified: Secondary | ICD-10-CM

## 2013-06-13 DIAGNOSIS — R509 Fever, unspecified: Secondary | ICD-10-CM | POA: Diagnosis present

## 2013-06-13 DIAGNOSIS — K648 Other hemorrhoids: Secondary | ICD-10-CM

## 2013-06-13 DIAGNOSIS — Z87891 Personal history of nicotine dependence: Secondary | ICD-10-CM

## 2013-06-13 DIAGNOSIS — I4891 Unspecified atrial fibrillation: Secondary | ICD-10-CM | POA: Diagnosis present

## 2013-06-13 DIAGNOSIS — M129 Arthropathy, unspecified: Secondary | ICD-10-CM | POA: Diagnosis present

## 2013-06-13 DIAGNOSIS — I495 Sick sinus syndrome: Secondary | ICD-10-CM

## 2013-06-13 DIAGNOSIS — Z95828 Presence of other vascular implants and grafts: Secondary | ICD-10-CM

## 2013-06-13 DIAGNOSIS — R0989 Other specified symptoms and signs involving the circulatory and respiratory systems: Secondary | ICD-10-CM | POA: Diagnosis not present

## 2013-06-13 DIAGNOSIS — Z66 Do not resuscitate: Secondary | ICD-10-CM | POA: Diagnosis present

## 2013-06-13 DIAGNOSIS — D649 Anemia, unspecified: Secondary | ICD-10-CM | POA: Diagnosis present

## 2013-06-13 DIAGNOSIS — Z85828 Personal history of other malignant neoplasm of skin: Secondary | ICD-10-CM

## 2013-06-13 DIAGNOSIS — Z8679 Personal history of other diseases of the circulatory system: Secondary | ICD-10-CM

## 2013-06-13 DIAGNOSIS — I5033 Acute on chronic diastolic (congestive) heart failure: Secondary | ICD-10-CM | POA: Diagnosis present

## 2013-06-13 DIAGNOSIS — R0609 Other forms of dyspnea: Secondary | ICD-10-CM | POA: Diagnosis not present

## 2013-06-13 DIAGNOSIS — Z950 Presence of cardiac pacemaker: Secondary | ICD-10-CM

## 2013-06-13 DIAGNOSIS — I509 Heart failure, unspecified: Secondary | ICD-10-CM | POA: Diagnosis present

## 2013-06-13 DIAGNOSIS — H9190 Unspecified hearing loss, unspecified ear: Secondary | ICD-10-CM | POA: Diagnosis present

## 2013-06-13 DIAGNOSIS — I4892 Unspecified atrial flutter: Secondary | ICD-10-CM | POA: Diagnosis present

## 2013-06-13 DIAGNOSIS — I739 Peripheral vascular disease, unspecified: Secondary | ICD-10-CM | POA: Diagnosis present

## 2013-06-13 DIAGNOSIS — N179 Acute kidney failure, unspecified: Secondary | ICD-10-CM | POA: Diagnosis present

## 2013-06-13 DIAGNOSIS — Z9889 Other specified postprocedural states: Secondary | ICD-10-CM

## 2013-06-13 DIAGNOSIS — K921 Melena: Secondary | ICD-10-CM

## 2013-06-13 DIAGNOSIS — R2681 Unsteadiness on feet: Secondary | ICD-10-CM

## 2013-06-13 DIAGNOSIS — N400 Enlarged prostate without lower urinary tract symptoms: Secondary | ICD-10-CM | POA: Diagnosis present

## 2013-06-13 DIAGNOSIS — I10 Essential (primary) hypertension: Secondary | ICD-10-CM | POA: Diagnosis present

## 2013-06-13 DIAGNOSIS — R5381 Other malaise: Secondary | ICD-10-CM | POA: Diagnosis present

## 2013-06-13 DIAGNOSIS — R269 Unspecified abnormalities of gait and mobility: Secondary | ICD-10-CM | POA: Diagnosis present

## 2013-06-13 LAB — BASIC METABOLIC PANEL
BUN: 35 mg/dL — ABNORMAL HIGH (ref 6–23)
CO2: 21 mEq/L (ref 19–32)
CREATININE: 1.4 mg/dL — AB (ref 0.50–1.35)
Calcium: 7.7 mg/dL — ABNORMAL LOW (ref 8.4–10.5)
Chloride: 101 mEq/L (ref 96–112)
GFR calc Af Amer: 49 mL/min — ABNORMAL LOW (ref >90)
GFR calc non Af Amer: 42 mL/min — ABNORMAL LOW (ref >90)
Glucose, Bld: 103 mg/dL — ABNORMAL HIGH (ref 70–99)
POTASSIUM: 4.7 meq/L (ref 3.7–5.3)
Sodium: 134 mEq/L — ABNORMAL LOW (ref 137–147)

## 2013-06-13 LAB — CBC WITH DIFFERENTIAL/PLATELET
Basophils Absolute: 0 10*3/uL (ref 0.0–0.1)
Basophils Relative: 0 % (ref 0–1)
Eosinophils Absolute: 0 10*3/uL (ref 0.0–0.7)
Eosinophils Relative: 0 % (ref 0–5)
HCT: 21.4 % — ABNORMAL LOW (ref 39.0–52.0)
Hemoglobin: 6.4 g/dL — CL (ref 13.0–17.0)
LYMPHS ABS: 0.3 10*3/uL — AB (ref 0.7–4.0)
LYMPHS PCT: 4 % — AB (ref 12–46)
MCH: 28.7 pg (ref 26.0–34.0)
MCHC: 29.9 g/dL — ABNORMAL LOW (ref 30.0–36.0)
MCV: 96 fL (ref 78.0–100.0)
MONOS PCT: 4 % (ref 3–12)
Monocytes Absolute: 0.3 10*3/uL (ref 0.1–1.0)
NEUTROS PCT: 92 % — AB (ref 43–77)
Neutro Abs: 7.6 10*3/uL (ref 1.7–7.7)
Platelets: 109 10*3/uL — ABNORMAL LOW (ref 150–400)
RBC: 2.23 MIL/uL — ABNORMAL LOW (ref 4.22–5.81)
RDW: 19.3 % — AB (ref 11.5–15.5)
WBC: 8.2 10*3/uL (ref 4.0–10.5)

## 2013-06-13 LAB — LACTATE DEHYDROGENASE: LDH: 169 U/L (ref 94–250)

## 2013-06-13 LAB — TROPONIN I: Troponin I: <0.3 ng/mL (ref <0.30)

## 2013-06-13 LAB — RETICULOCYTES
RBC.: 2.18 MIL/uL — ABNORMAL LOW (ref 4.22–5.81)
Retic Count, Absolute: 69.8 10*3/uL (ref 19.0–186.0)
Retic Ct Pct: 3.2 % — ABNORMAL HIGH (ref 0.4–3.1)

## 2013-06-13 LAB — LACTIC ACID, PLASMA: Lactic Acid, Venous: 2 mmol/L (ref 0.5–2.2)

## 2013-06-13 LAB — PRO B NATRIURETIC PEPTIDE: Pro B Natriuretic peptide (BNP): 8849 pg/mL — ABNORMAL HIGH (ref 0–450)

## 2013-06-13 LAB — PREPARE RBC (CROSSMATCH)

## 2013-06-13 MED ORDER — DILTIAZEM HCL 25 MG/5ML IV SOLN: 15.0000 mg | Freq: Once | INTRAVENOUS | Status: DC

## 2013-06-13 MED ORDER — SODIUM CHLORIDE 0.9 % IV BOLUS (SEPSIS)
250.0000 mL | Freq: Once | INTRAVENOUS | Status: AC
  Administered 2013-06-14: 250 mL via INTRAVENOUS

## 2013-06-13 MED ORDER — DILTIAZEM HCL 100 MG IV SOLR
5.0000 mg/h | INTRAVENOUS | Status: DC
  Administered 2013-06-13: 5 mg/h via INTRAVENOUS
  Filled 2013-06-13: qty 100

## 2013-06-13 MED ORDER — SODIUM CHLORIDE 0.9 % IV BOLUS (SEPSIS)
250.0000 mL | Freq: Once | INTRAVENOUS | Status: AC
  Administered 2013-06-13: 250 mL via INTRAVENOUS

## 2013-06-13 MED ORDER — VANCOMYCIN HCL IN DEXTROSE 750-5 MG/150ML-% IV SOLN
750.0000 mg | INTRAVENOUS | Status: DC
  Administered 2013-06-13 – 2013-06-16 (×4): 750 mg via INTRAVENOUS
  Filled 2013-06-13 (×5): qty 150

## 2013-06-13 MED ORDER — SODIUM CHLORIDE 0.9 % IV SOLN
INTRAVENOUS | Status: DC
  Administered 2013-06-13: 21:00:00 via INTRAVENOUS

## 2013-06-13 MED ORDER — DILTIAZEM HCL 100 MG IV SOLR: 10.0000 mg/h | Freq: Once | INTRAVENOUS | Status: DC

## 2013-06-13 MED ORDER — DEXTROSE 5 % IV SOLN
1.0000 g | INTRAVENOUS | Status: DC
  Administered 2013-06-13 – 2013-06-16 (×4): 1 g via INTRAVENOUS
  Filled 2013-06-13 (×5): qty 1

## 2013-06-13 MED ORDER — ACETAMINOPHEN 500 MG PO TABS
1000.0000 mg | ORAL_TABLET | Freq: Four times a day (QID) | ORAL | Status: DC | PRN
  Administered 2013-06-13: 1000 mg via ORAL
  Filled 2013-06-13: qty 2

## 2013-06-13 MED ORDER — DILTIAZEM LOAD VIA INFUSION
10.0000 mg | Freq: Once | INTRAVENOUS | Status: AC
  Administered 2013-06-13: 10 mg via INTRAVENOUS
  Filled 2013-06-13: qty 10

## 2013-06-13 NOTE — ED Notes (Signed)
Has not been feeling well since yesterday.  Called EMS today when he experienced "a terrible shaking, thought I was going to shake to pieces" that began at 1730 tonight, lasted until en route to hospital.  + fever 101.8.  Denies pain.

## 2013-06-13 NOTE — ED Notes (Signed)
Dr Hunt Oris examing pt and speaking with pt/family regarding his DNR status and tonights situation.

## 2013-06-13 NOTE — Progress Notes (Signed)
ANTIBIOTIC CONSULT NOTE - INITIAL  Pharmacy Consult for Vancomycin and Cefepime Indication: Fever  No Known Allergies  Patient Measurements: Height: 5\' 9"  (175.3 cm) Weight: 162 lb (73.483 kg) IBW/kg (Calculated) : 70.7 Adjusted Body Weight: n/a  Vital Signs: Temp: 101.8 F (38.8 C) (05/03 1955) BP: 87/50 mmHg (05/03 2100) Pulse Rate: 144 (05/03 2100) Intake/Output from previous day:   Intake/Output from this shift:    Labs:  Recent Labs  06/13/13 2034  WBC 8.2  HGB 6.4*  PLT PENDING  CREATININE 1.40*   Estimated Creatinine Clearance: 34.4 ml/min (by C-G formula based on Cr of 1.4). No results found for this basename: VANCOTROUGH, VANCOPEAK, VANCORANDOM, GENTTROUGH, GENTPEAK, GENTRANDOM, TOBRATROUGH, TOBRAPEAK, TOBRARND, AMIKACINPEAK, AMIKACINTROU, AMIKACIN,  in the last 72 hours   Microbiology: No results found for this or any previous visit (from the past 720 hour(s)).  Medical History: Past Medical History  Diagnosis Date  . Atrial fibrillation     paroxysmal  . Atrial flutter   . Unspecified essential hypertension   . Anal fissure   . Hyperlipidemia   . PVD (peripheral vascular disease)     s/p fem-fem bypass grafting 2006  . AAA (abdominal aortic aneurysm)     s/p graft repair 2000  . SSS (sick sinus syndrome)     s/p Medtronic PPM implantation 07/2008  . Mild claudication   . Pacemaker   . Anemia   . History of blood transfusion     "bleeding out rectum" (02/12/2013)  . Gout   . Arthritis     "touches"  . Basal cell carcinoma of head     "4 times"  . Dysrhythmia   . HOH (hard of hearing)   . Broken arm     left 1961   Assessment: 80 yo male admitted with fever.  Pharmacy asked to begin empiric antibiotic coverage with vancomycin and cefepime.  Tm 101.8.  Scr 1.4, estimated CrCl ~ 35 ml/min.  Goal of Therapy:  Vancomycin trough level 15-20 mcg/ml  Plan:  1. Vancomycin 750 mg IV q 24 hrs.  1st dose now. 2. Cefepime 1g IV q 24 hrs - 1st  dose now. 3. Will f/u cultures and clinical course.  Uvaldo Rising, BCPS  Clinical Pharmacist Pager [phone]  06/13/2013 10:07 PM

## 2013-06-13 NOTE — H&P (Signed)
PCP:   Donnajean Lopes, MD   Chief Complaint:  Chills, tremors  HPI: Mr. 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 Patient was hospitalized from 3/26 bruits 3/28 for severe anemia with a hemoglobin of 6.5. He was transfused 2 units packed red blood cells and underwent an EGD which showed no active source of bleeding. CT angiogram of the abdomen and pelvis showed findings consistent with an aortic enteric fistula related to his prior AAA repair. Vascular surgery was consult it and did not feel that he was a surgical candidate. They recommended palliative/end of life care.  Since discharge on 3/28 he has felt fairly well. He has been participating with physical therapy and occupational therapy ambulating to the dining room at his independent living community.  His stools occur daily and remain black which he attributes to iron.  Today, around dinnertime he began having shaking chills and weakness. This prompted his wife to call EMS. Brought to the emergency department where his temperature is 101.8, heart rate 146, and blood pressure in the 80s to 100s. His hemoglobin is again 6.4. He was started on a Cardizem drip in the emergency department but his blood pressure remains low. He has received 750 cc of normal saline with no urine output. We were called for admission.  Review of Systems:  Review of Systems - All systems reviewed with patient and are negative as in history of present illness the following exceptions: Chronic bilateral lower extremity edema Past Medical History: Past Medical History  Diagnosis Date  . Atrial fibrillation     paroxysmal  . Atrial flutter   . Unspecified essential hypertension   . Anal fissure   . Hyperlipidemia   . PVD (peripheral  vascular disease)     s/p fem-fem bypass grafting 2006  . AAA (abdominal aortic aneurysm)     s/p graft repair 2000  . SSS (sick sinus syndrome)     s/p Medtronic PPM implantation 07/2008  . Mild claudication   . Pacemaker   . Anemia   . History of blood transfusion     "bleeding out rectum" (02/12/2013)  . Gout   . Arthritis     "touches"  . Basal cell carcinoma of head     "4 times"  . Dysrhythmia   . HOH (hard of hearing)   . Broken arm     left 1961     HTN, benign prostate hypertrophy, arrhythmia, gait instability, decreased hearing, colon polyps, Coumadin anticoagulation per his cardiologist 6/10 Cardiac DDD Pacemaker placed due to sick sinus syndrome 3/15 Upper GI bleed with EGD normal, felt due to aortoenteric fistula with no treatments available 4/15 Depression noted by [facility], declines med for this  Physicians involved in care:  Curt Jews, Peter Martinique, Ruta Hinds, Silvano Rusk (GI)   Past Surgical History  Procedure Laterality Date  . Skin cancer excision      4 times "off my head; they were not melamonas" (3/262015"  . Cataract extraction w/ intraocular lens  implant, bilateral Bilateral   . Tonsillectomy    . Abdominal aortic aneurysm repair    . Aortic stent graft    . US echocardiography  07/15/2008    EF 55-60%  . Endovascular stent insertion  01/09/2011    Procedure: ENDOVASCULAR STENT GRAFT INSERTION;  Surgeon: Juanda Crumble  Antony Blackbird, MD;  Location: [facility];  Service: Vascular;  Laterality: Right;  With Left  Brachial artery cut-down  . Abdominal aortagram  02/12/2013    embolism left internal iliac artery  . Insert / replace / remove pacemaker  07/19/2008;10/23/2012    MDT Enrhythm, subsequently changed by Dr Rayann Heman due to premature battery failure;generator change, MDT Sherril Croon by Dr Rayann Heman  . Pacemaker generator change  10/23/2012     MDT Sherril Croon by Dr Rayann Heman  . Anal fissure repair  ~ 1952  . Inguinal hernia repair Right   . Hemorrhoid banding    .  Esophagogastroduodenoscopy N/A 05/07/2013    Procedure: ESOPHAGOGASTRODUODENOSCOPY (EGD);  Surgeon: Gatha Mayer, MD;  Location: [facility] ENDOSCOPY;  Service: Endoscopy;  Laterality: N/A;    Tonsillectomy and adenoidectomy (childhood), R side hernia [phone]), L arm fracture (1961), AAA endovascular repair (2000), AAA endovascular repair (2003),  right to left Femoral to Femoral Artery bypass (2006), Skin cancer removed from top of head (2006) and the scalp near the right ear (2010), he had a pacemaker placed in 2010 (DDD type) 12/12 Rupture of AAA with endovascular repair 1/15 Coil embollization of Left IIA and AAA by Dr. Oneida Alar   Medications: Prior to Admission medications   Medication Sig Start Date End Date Taking? Authorizing Provider  allopurinol (ZYLOPRIM) 300 MG tablet Take 300 mg by mouth daily.    Yes Historical Provider, MD  CALCIUM-VITAMIN D PO Take 1 tablet by mouth 2 (two) times daily.   Yes Historical Provider, MD  finasteride (PROSCAR) 5 MG tablet Take 5 mg by mouth daily.    Yes Historical Provider, MD  iron polysaccharides (NIFEREX) 150 MG capsule Take 150 mg by mouth 2 (two) times daily.   Yes Historical Provider, MD  pantoprazole (PROTONIX) 40 MG tablet Take 1 tablet (40 mg total) by mouth daily at 6 (six) AM. 05/08/13  Yes Velna Hatchet, MD  simvastatin (ZOCOR) 20 MG tablet Take 20 mg by mouth at bedtime.    Yes Historical Provider, MD  metoprolol (LOPRESSOR) 50 MG tablet Take 25 mg by mouth 2 (two) times daily.     Historical Provider, MD    Allergies:  No Known Allergies  Social History: Mr. [name] is married with 3 children that are alive and well.  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  He also served in the [place] and the [place] War.  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  He has history of smoking 1ppd for 34 years; he quit in 1973. Reports occational  alcoholic beverage and denies illicit drugs.  He states that he walks about 20 minutes a day.    Family History: Family History  Problem Relation Age of Onset  . Breast cancer Daughter     granddaughter  . Cancer Daughter   . Diabetes Mother   . Liver disease Mother   . AAA (abdominal aortic aneurysm) Son   . Dementia Father    Father: Deceased at age 28 (unknown cause). History of TB Mother: Deceased a age 22 (unknown cause) 1Sister: alive and well   Physical Exam: Filed Vitals:   06/13/13 1955 06/13/13 2000 06/13/13 2015 06/13/13 2100  BP: 102/46 98/49 90/48  87/50  Pulse:  152 157 144  Temp: 101.8 F (38.8 C)     Resp: 24 21 22 24   Height: 5\' 9"  (  1.753 m)     Weight: 73.483 kg (162 lb)     SpO2: 100% 99% 100% 99%   General appearance: Appears generally than stated age, hard of hearing, pale-appearing Head: Normocephalic, without obvious abnormality, atraumatic Eyes: scleral pallor. PERRL, EOM's intact.  Nose: Nares normal. Septum midline. Mucosa normal. No drainage or sinus tenderness. Throat: lips, mucosa, and tongue normal; teeth and gums normal Neck: no adenopathy, no carotid bruit, no JVD and thyroid not enlarged, symmetric, no tenderness/mass/nodules Resp: clear to auscultation bilaterally Cardio: irregularly irregular rhythm, tachycardic GI: soft, non-tender; bowel sounds normal; no masses,  no organomegaly Extremities: extremities normal, atraumatic, no cyanosis; 2+ pitting edema to bilateral knees Pulses: 2+ and symmetric Lymph nodes: Cervical adenopathy: no cervical lymphadenopathy Neurologic: Alert and oriented X 3, normal strength and tone. Normal symmetric reflexes.   Labs on Admission:   Recent Labs  06/13/13 2034  NA 134*  K 4.7  CL 101  CO2 21  GLUCOSE 103*  BUN 35*  CREATININE 1.40*  CALCIUM 7.7*     Recent Labs  06/13/13 2034  WBC 8.2  NEUTROABS PENDING  HGB 6.4*  HCT 21.4*  MCV 96.0  PLT PENDING    Recent Labs   06/13/13 2034  TROPONINI <0.30   Lab Results  Component Value Date   INR 1.69* 05/07/2013   INR 2.48* 05/06/2013   INR 1.22 02/12/2013    Recent Studies: CT Angio Abd/Pelvis (05/07/13) Impression: Enlargement of the abdominal aortic aneurysm sac with development of  intra-sac gas and surrounding fluid and edema. The findings are  compatible with an aorta-enteric fistula between the aortic aneurysm  sac and the distal duodenum. The fluid around the aneurysm sac could  represent blood products and the patient has a known aortic  endoleak. Endoleak is not well demonstrated on this single phase of  imaging.  Small amount of edema and fluid throughout the abdomen and pelvis.  Development of bilateral pleural effusions.   EGD (05/07/13)- normal; no source of bleeding identified  Dg Chest Port 1 View  06/13/2013   CLINICAL DATA:  Fever, tachycardia  EXAM: PORTABLE CHEST - 1 VIEW  COMPARISON:  DG CHEST 1V PORT dated 01/09/2011  FINDINGS: There is no focal parenchymal opacity, pleural effusion, or pneumothorax. The heart size is normal. There is enlargement of the right hilum unchanged compared with 01/09/2011 likely reflecting prominent pulmonary vasculature. There is a dual lead cardiac pacer.  The osseous structures are unremarkable.  IMPRESSION: No active disease.   Electronically Signed   By: Kathreen Devoid   On: 06/13/2013 20:59   Orders placed during the hospital encounter of 06/13/13  . EKG 12-LEAD  . EKG 12-LEAD    Assessment/Plan Principal Problem: 1. Hemorrhagic shock-  Profound anemia, hypotension, and tachycardia are concerning for hemorrhagic shock secondary to acute bleeding vs. Sepsis. We discussed goals of care and treatment options. We have elected to transfuse 2 units packed red blood cells and continue IV fluids tonight. We'll reassess in the morning to determine his response to transfusion. Since he responded well to a transfusion in 3/15, I think it is reasonable to proceed  with transfusion but if he is not stabilizing we'll proceed with comfort care  Active Problems: 2. Fever- while fever may be related to his hemorrhagic shock it may also be indicative of underlying infectious cause/sepsis. We will treat with broad-spectrum antibiotics including vancomycin and cefepime pending culture data. Urinalysis has been ordered but not yet obtained.  Consider discontinuation of antibiotics  if condition declines. 3. Aortoenteric fistula-this has been deemed nonoperative due to his frail status and advanced age. 4. Atrial fibrillation with RVR-Cardizem was started in the emergency department but is being discontinued due to hypotension. Suspect anemia is driving his tachycardia.   5. Anemia- presumably secondary to Aortoenteric fistula.  Anemia panel and hemolysis labs ordered. 6. Acute renal failure-monitor with hydration and stabilization of his blood pressure. 7.  Ethics-we discussed his poor prognosis in both the short-term and long-term. He and his family understand that this may be a terminal event. He has clearly stated that he does not want to be uncomfortable- I have assured him that we can use morphine as needed for comfort care. DO NOT RESUSCITATE verified. Consider palliative care/hospice consult- defer to PCP in the morning.   Marton Redwood 06/13/2013, 10:06 PM

## 2013-06-13 NOTE — ED Notes (Signed)
Critical value reported to Dr Thurnell Garbe.  HGB 6.4

## 2013-06-13 NOTE — ED Provider Notes (Signed)
CSN: [id number]     Arrival date & time 06/13/13  1936 History   First MD Initiated Contact with Patient 06/13/13 2013     Chief Complaint  Patient presents with  . Fever  . Tachycardia      HPI Pt was seen at 2005. Per EMS, pt and his family, c/o sudden onset and persistence of constant "shaking" that began approx 1730 PTA. EMS noted pt's temp en route to the ED to be "101.8." Pt states he "just doesn't feel well," but denies any specific complaint. Denies CP/palpitations, no SOB, no change in his chronic cough, no abd pain, no N/V/D, no rash, no syncope/near syncope.      DNR Past Medical History  Diagnosis Date  . Atrial fibrillation     paroxysmal  . Atrial flutter   . Unspecified essential hypertension   . Anal fissure   . Hyperlipidemia   . PVD (peripheral vascular disease)     s/p fem-fem bypass grafting 2006  . AAA (abdominal aortic aneurysm)     s/p graft repair 2000  . SSS (sick sinus syndrome)     s/p Medtronic PPM implantation 07/2008  . Mild claudication   . Pacemaker   . Anemia   . History of blood transfusion     "bleeding out rectum" (02/12/2013)  . Gout   . Arthritis     "touches"  . Basal cell carcinoma of head     "4 times"  . Dysrhythmia   . HOH (hard of hearing)   . Broken arm     left 1961   Past Surgical History  Procedure Laterality Date  . Skin cancer excision      4 times "off my head; they were not melamonas" (3/262015"  . Cataract extraction w/ intraocular lens  implant, bilateral Bilateral   . Tonsillectomy    . Abdominal aortic aneurysm repair    . Aortic stent graft    . US echocardiography  07/15/2008    EF 55-60%  . Endovascular stent insertion  01/09/2011    Procedure: ENDOVASCULAR STENT GRAFT INSERTION;  Surgeon: Elam Dutch, MD;  Location: [facility];  Service: Vascular;  Laterality: Right;  With Left  Brachial artery cut-down  . Abdominal aortagram  02/12/2013    embolism left internal iliac artery  . Insert / replace / remove  pacemaker  07/19/2008;10/23/2012    MDT Enrhythm, subsequently changed by Dr Rayann Heman due to premature battery failure;generator change, MDT Sherril Croon by Dr Rayann Heman  . Pacemaker generator change  10/23/2012     MDT Sherril Croon by Dr Rayann Heman  . Anal fissure repair  ~ 1952  . Inguinal hernia repair Right   . Hemorrhoid banding    . Esophagogastroduodenoscopy N/A 05/07/2013    Procedure: ESOPHAGOGASTRODUODENOSCOPY (EGD);  Surgeon: Gatha Mayer, MD;  Location: [facility] ENDOSCOPY;  Service: Endoscopy;  Laterality: N/A;   Family History  Problem Relation Age of Onset  . Breast cancer Daughter     granddaughter  . Cancer Daughter   . Diabetes Mother   . Liver disease Mother   . AAA (abdominal aortic aneurysm) Son   . Dementia Father    History  Substance Use Topics  . Smoking status: Former Smoker -- 1.00 packs/day for 32 years    Types: Cigarettes    Quit date: 02/12/1971  . Smokeless tobacco: Never Used  . Alcohol Use: No     Comment: 05/06/2013 "not drinking much alcohol at all now"    Review of  Systems ROS: Statement: All systems negative except as marked or noted in the HPI; Constitutional: +fever and chills. ; ; Eyes: Negative for eye pain, redness and discharge. ; ; ENMT: Negative for ear pain, hoarseness, nasal congestion, sinus pressure and sore throat. ; ; Cardiovascular: Negative for chest pain, palpitations, diaphoresis, dyspnea and peripheral edema. ; ; Respiratory: Negative for wheezing and stridor. ; ; Gastrointestinal: Negative for nausea, vomiting, diarrhea, abdominal pain, blood in stool, hematemesis, jaundice and rectal bleeding. . ; ; Genitourinary: Negative for dysuria, flank pain and hematuria. ; ; Musculoskeletal: Negative for back pain and neck pain. Negative for swelling and trauma.; ; Skin: Negative for pruritus, rash, abrasions, blisters, bruising and skin lesion.; ; Neuro: Negative for headache, lightheadedness and neck stiffness. Negative for weakness, altered level of consciousness ,  altered mental status, extremity weakness, paresthesias, involuntary movement, seizure and syncope.      Allergies  Review of patient's allergies indicates no known allergies.  Home Medications   Prior to Admission medications   Medication Sig Start Date End Date Taking? Authorizing Provider  allopurinol (ZYLOPRIM) 300 MG tablet Take 300 mg by mouth daily.    Yes Historical Provider, MD  CALCIUM-VITAMIN D PO Take 1 tablet by mouth 2 (two) times daily.   Yes Historical Provider, MD  finasteride (PROSCAR) 5 MG tablet Take 5 mg by mouth daily.    Yes Historical Provider, MD  iron polysaccharides (NIFEREX) 150 MG capsule Take 150 mg by mouth 2 (two) times daily.   Yes Historical Provider, MD  pantoprazole (PROTONIX) 40 MG tablet Take 1 tablet (40 mg total) by mouth daily at 6 (six) AM. 05/08/13  Yes Velna Hatchet, MD  simvastatin (ZOCOR) 20 MG tablet Take 20 mg by mouth at bedtime.    Yes Historical Provider, MD  metoprolol (LOPRESSOR) 50 MG tablet Take 25 mg by mouth 2 (two) times daily.     Historical Provider, MD   BP 87/50  Pulse 144  Temp(Src) 98.2 F (36.8 C)  Resp 24  Ht 5\' 9"  (1.753 m)  Wt 162 lb (73.483 kg)  BMI 23.91 kg/m2  SpO2 99% Filed Vitals:   06/13/13 2015 06/13/13 2100 06/13/13 2245 06/13/13 2300  BP: 90/48 87/50    Pulse: 157 144    Temp:   98.5 F (36.9 C) 98.2 F (36.8 C)  Resp: 22 24    Height:      Weight:      SpO2: 100% 99%     Filed Vitals:   06/13/13 2300 06/13/13 2315 06/13/13 2330 06/13/13 2345  BP: 91/47 79/45 80/38  97/42  Pulse: 132 138 130 125  Temp: 98.2 F (36.8 C)  97.9 F (36.6 C)   Resp: 17 20 15 16   Height:      Weight:      SpO2: 99% 99% 100% 99%     Physical Exam 2010: Physical examination:  Nursing notes reviewed; Vital signs and O2 SAT reviewed;  Constitutional: Well developed, Well nourished, In no acute distress; Head:  Normocephalic, atraumatic; Eyes: EOMI, PERRL, No scleral icterus; ENMT: Mouth and pharynx normal, Mucous  membranes dry; Neck: Supple, Full range of motion, No lymphadenopathy; Cardiovascular: Tachycardic rate and irregular irregular rhythm, No gallop; Respiratory: Breath sounds coarse & equal bilaterally, No wheezes.  Speaking full sentences with ease, Normal respiratory effort/excursion; Chest: Nontender, Movement normal; Abdomen: Soft, Nontender, Nondistended, Normal bowel sounds; Genitourinary: No CVA tenderness; Extremities: Pulses normal, No tenderness, +2 pedal edema bilat without calf asymmetry.; Neuro: AA&Ox3, +HOH,  otherwise major CN grossly intact. No facial droop. Speech clear. No gross focal motor or sensory deficits in extremities.; Skin: Color pale, Warm, Dry.    ED Course  Procedures     EKG Interpretation   Date/Time:  Sunday Jun 13 2013 19:49:36 EDT Ventricular Rate:  151 PR Interval:    QRS Duration: 70 QT Interval:  307 QTC Calculation: 487 R Axis:   -55 Text Interpretation:  Atrial fibrillation with rapid V-rate LAD, consider  left anterior fascicular block Borderline T abnormalities, inferior leads  When compared with ECG of 10/23/2012 demand pacemaker was present Confirmed  by [facility]  MD, Nunzio Cory [phone]) on 06/13/2013 8:39:34 PM      MDM  MDM Reviewed: previous chart, nursing note and vitals Reviewed previous: labs and ECG Interpretation: labs, ECG and x-ray Total time providing critical care: 30-74 minutes. This excludes time spent performing separately reportable procedures and services. Consults: critical care and admitting MD    CRITICAL CARE Performed by: Alfonzo Feller Total critical care time: 50 Critical care time was exclusive of separately billable procedures and treating other patients. Critical care was necessary to treat or prevent imminent or life-threatening deterioration. Critical care was time spent personally by me on the following activities: development of treatment plan with patient and/or surrogate as well as nursing, discussions  with consultants, evaluation of patient's response to treatment, examination of patient, obtaining history from patient or surrogate, ordering and performing treatments and interventions, ordering and review of laboratory studies, ordering and review of radiographic studies, pulse oximetry and re-evaluation of patient's condition.   Results for orders placed during the hospital encounter of 06/13/13  CBC WITH DIFFERENTIAL      Result Value Ref Range   WBC 8.2  4.0 - 10.5 K/uL   RBC 2.23 (*) 4.22 - 5.81 MIL/uL   Hemoglobin 6.4 (*) 13.0 - 17.0 g/dL   HCT 21.4 (*) 39.0 - 52.0 %   MCV 96.0  78.0 - 100.0 fL   MCH 28.7  26.0 - 34.0 pg   MCHC 29.9 (*) 30.0 - 36.0 g/dL   RDW 19.3 (*) 11.5 - 15.5 %   Platelets 109 (*) 150 - 400 K/uL   Neutrophils Relative % 92 (*) 43 - 77 %   Lymphocytes Relative 4 (*) 12 - 46 %   Monocytes Relative 4  3 - 12 %   Eosinophils Relative 0  0 - 5 %   Basophils Relative 0  0 - 1 %   Neutro Abs 7.6  1.7 - 7.7 K/uL   Lymphs Abs 0.3 (*) 0.7 - 4.0 K/uL   Monocytes Absolute 0.3  0.1 - 1.0 K/uL   Eosinophils Absolute 0.0  0.0 - 0.7 K/uL   Basophils Absolute 0.0  0.0 - 0.1 K/uL   Smear Review MORPHOLOGY UNREMARKABLE    BASIC METABOLIC PANEL      Result Value Ref Range   Sodium 134 (*) 137 - 147 mEq/L   Potassium 4.7  3.7 - 5.3 mEq/L   Chloride 101  96 - 112 mEq/L   CO2 21  19 - 32 mEq/L   Glucose, Bld 103 (*) 70 - 99 mg/dL   BUN 35 (*) 6 - 23 mg/dL   Creatinine, Ser 1.40 (*) 0.50 - 1.35 mg/dL   Calcium 7.7 (*) 8.4 - 10.5 mg/dL   GFR calc non Af Amer 42 (*) >90 mL/min   GFR calc Af Amer 49 (*) >90 mL/min  TROPONIN I  Result Value Ref Range   Troponin I <0.30  <0.30 ng/mL  LACTIC ACID, PLASMA      Result Value Ref Range   Lactic Acid, Venous 2.0  0.5 - 2.2 mmol/L  PRO B NATRIURETIC PEPTIDE      Result Value Ref Range   Pro B Natriuretic peptide (BNP) 8849.0 (*) 0 - 450 pg/mL  RETICULOCYTES      Result Value Ref Range   Retic Ct Pct 3.2 (*) 0.4 - 3.1 %    RBC. 2.18 (*) 4.22 - 5.81 MIL/uL   Retic Count, Manual 69.8  19.0 - 186.0 K/uL  LACTATE DEHYDROGENASE      Result Value Ref Range   LDH 169  94 - 250 U/L  TYPE AND SCREEN      Result Value Ref Range   ABO/RH(D) O NEG     Antibody Screen NEG     Sample Expiration 06/16/2013     Unit Number [id number]     Blood Component Type RED CELLS,LR     Unit division 00     Status of Unit ALLOCATED     Transfusion Status OK TO TRANSFUSE     Crossmatch Result Compatible     Unit Number [id number]     Blood Component Type RED CELLS,LR     Unit division 00     Status of Unit ISSUED     Transfusion Status OK TO TRANSFUSE     Crossmatch Result Compatible    PREPARE RBC (CROSSMATCH)      Result Value Ref Range   Order Confirmation ORDER PROCESSED BY BLOOD BANK     Dg Chest Port 1 View 06/13/2013   CLINICAL DATA:  Fever, tachycardia  EXAM: PORTABLE CHEST - 1 VIEW  COMPARISON:  DG CHEST 1V PORT dated 01/09/2011  FINDINGS: There is no focal parenchymal opacity, pleural effusion, or pneumothorax. The heart size is normal. There is enlargement of the right hilum unchanged compared with 01/09/2011 likely reflecting prominent pulmonary vasculature. There is a dual lead cardiac pacer.  The osseous structures are unremarkable.  IMPRESSION: No active disease.   Electronically Signed   By: Kathreen Devoid   On: 06/13/2013 20:59   Results for LUCHIANO, VISCOMI (MRN [id number]) as of 06/13/2013 23:55  Ref. Range 05/06/2013 15:55 05/07/2013 12:52 05/08/2013 04:05 06/13/2013 20:34  Hemoglobin Latest Range: 13.0-17.0 g/dL 6.0 (LL) 7.8 (L) 8.7 (L) 6.4 (LL)  HCT Latest Range: 39.0-52.0 % 19.4 (L) 24.0 (L) 27.7 (L) 21.4 (L)     2015:  APAP given for pt's fever. Judicious IVF bolus given for SBP 90-100's. Monitor afib/RVR, rates 140-150's. Will start IV cardizem bolus and gtt.  2100:  Hbg 6. T/S and 2 units PRBC's transfusion ordered. Monitor afib/RVR continues with rates 120-130's. 2nd IV cardizem bolus and gtt  increased. Judicious IVF bolus given for SBP 80's. Broad spectrum IV abx ordered for fever. UA/UC and BC x2 pending.   2150:  Tachycardia multifactorial: afib, CHF, fever, and anemia. Fever improved after APAP. PRBC's starting to transfuse. HR trending downward 100-110's. SBP improving to 97 from 80. Pt remains A&O, resps easy, denies complaints currently. Pt and family reiterate pt's DNR status. Dx and testing d/w pt and family.  Questions answered.  Verb understanding, agreeable to admit. T/C to PCCM Dr. Elsworth Soho, case discussed, including:  HPI, pertinent PM/SHx, VS/PE, dx testing, ED course and treatment:  Agrees with ED treatment, states pt does not need ICU care at this time, requests to admit to medicine  service/stepdown. T/C to [facility] Dr. Brigitte Pulse, case discussed, including:  HPI, pertinent PM/SHx, VS/PE, dx testing, ED course and treatment:  Agreeable to admit, requests he will come to the ED for evaluation for admission.     Alfonzo Feller, DO 06/15/13 [phone]

## 2013-06-14 LAB — URINALYSIS, ROUTINE W REFLEX MICROSCOPIC
Bilirubin Urine: NEGATIVE
Glucose, UA: NEGATIVE mg/dL
Ketones, ur: NEGATIVE mg/dL
LEUKOCYTES UA: NEGATIVE
NITRITE: NEGATIVE
PROTEIN: 30 mg/dL — AB
SPECIFIC GRAVITY, URINE: 1.022 (ref 1.005–1.030)
UROBILINOGEN UA: 0.2 mg/dL (ref 0.0–1.0)
pH: 5 (ref 5.0–8.0)

## 2013-06-14 LAB — BASIC METABOLIC PANEL
BUN: 35 mg/dL — ABNORMAL HIGH (ref 6–23)
CO2: 21 mEq/L (ref 19–32)
CREATININE: 1.32 mg/dL (ref 0.50–1.35)
Calcium: 7.3 mg/dL — ABNORMAL LOW (ref 8.4–10.5)
Chloride: 104 mEq/L (ref 96–112)
GFR calc non Af Amer: 45 mL/min — ABNORMAL LOW (ref >90)
GFR, EST AFRICAN AMERICAN: 53 mL/min — AB (ref >90)
Glucose, Bld: 106 mg/dL — ABNORMAL HIGH (ref 70–99)
Potassium: 4.1 mEq/L (ref 3.7–5.3)
Sodium: 135 mEq/L — ABNORMAL LOW (ref 137–147)

## 2013-06-14 LAB — URINE MICROSCOPIC-ADD ON

## 2013-06-14 LAB — CBC
HCT: 22.2 % — ABNORMAL LOW (ref 39.0–52.0)
Hemoglobin: 6.9 g/dL — CL (ref 13.0–17.0)
MCH: 30.1 pg (ref 26.0–34.0)
MCHC: 31.1 g/dL (ref 30.0–36.0)
MCV: 96.9 fL (ref 78.0–100.0)
Platelets: 90 10*3/uL — ABNORMAL LOW (ref 150–400)
RBC: 2.29 MIL/uL — ABNORMAL LOW (ref 4.22–5.81)
RDW: 18.4 % — AB (ref 11.5–15.5)
WBC: 7.9 10*3/uL (ref 4.0–10.5)

## 2013-06-14 LAB — IRON AND TIBC
Iron: <10 ug/dL — ABNORMAL LOW (ref 42–135)
UIBC: 123 ug/dL — AB (ref 125–400)

## 2013-06-14 LAB — VITAMIN B12: Vitamin B-12: 633 pg/mL (ref 211–911)

## 2013-06-14 LAB — FOLATE

## 2013-06-14 LAB — HAPTOGLOBIN: Haptoglobin: 231 mg/dL — ABNORMAL HIGH (ref 45–215)

## 2013-06-14 LAB — FERRITIN: FERRITIN: 732 ng/mL — AB (ref 22–322)

## 2013-06-14 MED ORDER — FINASTERIDE 5 MG PO TABS
5.0000 mg | ORAL_TABLET | Freq: Every day | ORAL | Status: DC
  Administered 2013-06-14 – 2013-06-16 (×3): 5 mg via ORAL
  Filled 2013-06-14 (×4): qty 1

## 2013-06-14 MED ORDER — ONDANSETRON HCL 4 MG/2ML IJ SOLN: 4.0000 mg | Freq: Four times a day (QID) | INTRAMUSCULAR | Status: DC | PRN

## 2013-06-14 MED ORDER — ONDANSETRON HCL 4 MG PO TABS: 4.0000 mg | ORAL_TABLET | Freq: Four times a day (QID) | ORAL | Status: DC | PRN

## 2013-06-14 MED ORDER — LORAZEPAM 2 MG/ML IJ SOLN: 0.5000 mg | INTRAMUSCULAR | Status: DC | PRN

## 2013-06-14 MED ORDER — SODIUM CHLORIDE 0.9 % IJ SOLN
3.0000 mL | Freq: Two times a day (BID) | INTRAMUSCULAR | Status: DC
  Administered 2013-06-14 – 2013-06-16 (×6): 3 mL via INTRAVENOUS

## 2013-06-14 MED ORDER — SODIUM CHLORIDE 0.9 % IV SOLN: INTRAVENOUS | Status: AC

## 2013-06-14 MED ORDER — ALLOPURINOL 300 MG PO TABS
300.0000 mg | ORAL_TABLET | Freq: Every day | ORAL | Status: DC
Start: 2013-06-14 — End: 2013-06-17
  Administered 2013-06-14 – 2013-06-17 (×4): 300 mg via ORAL
  Filled 2013-06-14 (×4): qty 1

## 2013-06-14 MED ORDER — ALUM & MAG HYDROXIDE-SIMETH 200-200-20 MG/5ML PO SUSP: 30.0000 mL | Freq: Four times a day (QID) | ORAL | Status: DC | PRN

## 2013-06-14 MED ORDER — SIMVASTATIN 20 MG PO TABS
20.0000 mg | ORAL_TABLET | Freq: Every day | ORAL | Status: DC
  Administered 2013-06-14 – 2013-06-16 (×3): 20 mg via ORAL
  Filled 2013-06-14 (×4): qty 1

## 2013-06-14 MED ORDER — BIOTENE DRY MOUTH MT LIQD
15.0000 mL | Freq: Two times a day (BID) | OROMUCOSAL | Status: DC
  Administered 2013-06-14 – 2013-06-17 (×7): 15 mL via OROMUCOSAL

## 2013-06-14 MED ORDER — ACETAMINOPHEN 650 MG RE SUPP: 650.0000 mg | Freq: Four times a day (QID) | RECTAL | Status: DC | PRN

## 2013-06-14 MED ORDER — PANTOPRAZOLE SODIUM 40 MG PO TBEC
40.0000 mg | DELAYED_RELEASE_TABLET | Freq: Every day | ORAL | Status: DC
Start: 2013-06-14 — End: 2013-06-17
  Administered 2013-06-14 – 2013-06-17 (×4): 40 mg via ORAL
  Filled 2013-06-14 (×4): qty 1

## 2013-06-14 MED ORDER — POLYSACCHARIDE IRON COMPLEX 150 MG PO CAPS
150.0000 mg | ORAL_CAPSULE | Freq: Two times a day (BID) | ORAL | Status: DC
  Administered 2013-06-14 – 2013-06-17 (×7): 150 mg via ORAL
  Filled 2013-06-14 (×8): qty 1

## 2013-06-14 MED ORDER — ACETAMINOPHEN 325 MG PO TABS: 650.0000 mg | ORAL_TABLET | Freq: Four times a day (QID) | ORAL | Status: DC | PRN

## 2013-06-14 MED ORDER — SODIUM CHLORIDE 0.9 % IV SOLN
INTRAVENOUS | Status: DC
  Administered 2013-06-14 – 2013-06-16 (×6): via INTRAVENOUS

## 2013-06-14 MED ORDER — SODIUM CHLORIDE 0.9 % IV BOLUS (SEPSIS)
1000.0000 mL | Freq: Once | INTRAVENOUS | Status: AC
  Administered 2013-06-14: 1000 mL via INTRAVENOUS

## 2013-06-14 MED ORDER — MORPHINE SULFATE 2 MG/ML IJ SOLN
2.0000 mg | INTRAMUSCULAR | Status: DC | PRN
  Administered 2013-06-14 – 2013-06-15 (×2): 2 mg via INTRAVENOUS
  Filled 2013-06-14 (×2): qty 1

## 2013-06-14 NOTE — Progress Notes (Signed)
UR completed 

## 2013-06-14 NOTE — Progress Notes (Signed)
Pt tele rings v-tach.  Called central tele, states it is not real that it is double counting. Pt has pacemaker.  No ss,pt asymptomatic.  But central tele has stated every time not real.  Checked tele placement and leads all ok.[name] [name]

## 2013-06-14 NOTE — Progress Notes (Signed)
Subjective: Feeling Ok and while lying supine he has mild mid abdominal pain, no nausea, chest pain, or dyspnea.  Objective: Vital signs in last 24 hours: Temp:  [97.5 F (36.4 C)-101.8 F (38.8 C)] 97.8 F (36.6 C) (05/04 0459) Pulse Rate:  [104-167] 133 (05/04 0459) Resp:  [13-25] 17 (05/04 0459) BP: (69-104)/(38-63) 69/40 mmHg (05/04 0459) SpO2:  [99 %-100 %] 100 % (05/04 0459) Weight:  [73.483 kg (162 lb)-74.27 kg (163 lb 11.8 oz)] 74.27 kg (163 lb 11.8 oz) (05/04 0050) Weight change:    Intake/Output from previous day: 05/03 0701 - 05/04 0700 In: 3 [I.V.:3] Out: 320 [Urine:320]   General appearance: alert, cooperative and no distress Resp: clear to auscultation bilaterally Cardio: irregularly irregular rhythm GI: soft, non-tender; bowel sounds normal; no masses,  no organomegaly Extremities: bilateral 1+ leg edema  Lab Results:  Recent Labs  06/13/13 2034 06/14/13 0626  WBC 8.2 7.9  HGB 6.4* 6.9*  HCT 21.4* 22.2*  PLT 109* 90*   BMET  Recent Labs  06/13/13 2034 06/14/13 0626  NA 134* 135*  K 4.7 4.1  CL 101 104  CO2 21 21  GLUCOSE 103* 106*  BUN 35* 35*  CREATININE 1.40* 1.32  CALCIUM 7.7* 7.3*   CMET CMP     Component Value Date/Time   NA 135* 06/14/2013 0626   K 4.1 06/14/2013 0626   CL 104 06/14/2013 0626   CO2 21 06/14/2013 0626   GLUCOSE 106* 06/14/2013 0626   BUN 35* 06/14/2013 0626   CREATININE 1.32 06/14/2013 0626   CREATININE 1.23 01/25/2013 1033   CALCIUM 7.3* 06/14/2013 0626   PROT 5.9* 05/06/2013 1555   ALBUMIN 2.3* 05/06/2013 1555   AST 14 05/06/2013 1555   ALT 8 05/06/2013 1555   ALKPHOS 47 05/06/2013 1555   BILITOT 0.4 05/06/2013 1555   GFRNONAA 45* 06/14/2013 0626   GFRAA 53* 06/14/2013 0626    CBG (last 3)  No results found for this basename: GLUCAP,  in the last 72 hours  INR RESULTS:   Lab Results  Component Value Date   INR 1.69* 05/07/2013   INR 2.48* 05/06/2013   INR 1.22 02/12/2013     Studies/Results: Dg Chest Port 1  View  06/13/2013   CLINICAL DATA:  Fever, tachycardia  EXAM: PORTABLE CHEST - 1 VIEW  COMPARISON:  DG CHEST 1V PORT dated 01/09/2011  FINDINGS: There is no focal parenchymal opacity, pleural effusion, or pneumothorax. The heart size is normal. There is enlargement of the right hilum unchanged compared with 01/09/2011 likely reflecting prominent pulmonary vasculature. There is a dual lead cardiac pacer.  The osseous structures are unremarkable.  IMPRESSION: No active disease.   Electronically Signed   By: Kathreen Devoid   On: 06/13/2013 20:59    Medications: I have reviewed the patient's current medications.  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 HR is lower today and will continue broad spectrum IV antibiotics, IVF, and set up hospice evaluation for eventual discharge to home in the next few days if his condition continues to improve. #2 Atrial fibrillation: stable on current meds   LOS: 1 day   Leanna Battles 06/14/2013, 8:11 AM

## 2013-06-14 NOTE — Progress Notes (Signed)
CARE MANAGEMENT NOTE 06/14/2013  Patient:  [name] [name],[name] [name]   Account Number:  [id number]  Date Initiated:  06/14/2013  Documentation initiated by:  Lizabeth Leyden  Subjective/Objective Assessment:   admitted with fever, hemorrhagic shock     Action/Plan:   home with hospice   Anticipated DC Date:  06/16/2013   Anticipated DC Plan:  [place]  CM consult      Choice offered to / List presented to:             Status of service:  In process, will continue to follow Medicare Important Message given?   (If response is "NO", the following Medicare IM given date fields will be blank) Date Medicare IM given:   Date Additional Medicare IM given:    Discharge Disposition:    Per UR Regulation:    If discussed at Long Length of Stay Meetings, dates discussed:    Comments:  contact:  Reynaldo Rossman  spouse  Home: [phone]    Cell: [phone]  06/14/2013  [place], [place] [phone] CM referral: home hospice Call to spouse, left voice mail message at home and cell number to call NCM.

## 2013-06-14 NOTE — Progress Notes (Signed)
BP 69/40.  Patient asymptomatic.  MD on call notified.  Verbal orders received for stat CBC and 1 liter normal saline bolus.  Will continue to assess patient.

## 2013-06-14 NOTE — Progress Notes (Signed)
New Admission Note:   Arrival: via stretcher with ED RN Mental Orientation: A&Ox4 Telemetry: placed on box 6E22, sinus tachycardia at 110 with some atrial fibrillation Assessment:  See doc flowsheet Skin: bottom red but blanchable, old surgical incision on right groin, redness in abdominal folds at groin area, old scarring at head, brown discoloration on feet bilaterally, some bruises on arms bilaterally IV: 1) left lower forearm IV-saline locked, 2) left forearm IV-saline locked, 3) right forearm IV with unit of blood transfusing Pain: bilateral pain in feet 7/10-patient stated pain "has been happening for years" Safety Measures:  Call bell placed within reach; patient instructed on use of call bell and verbalized understanding. Bed in lowest position.  Refused to wear yellow non-skid socks.  Yellow fall risk bracelet on.  Bed alarm on.   [place] Orientation: Patient oriented to staff, unit, and room. Family: wife and daughter at bedside  Orders have been reviewed and implemented. Will continue to monitor.  Arlyss Queen, RN, BSN

## 2013-06-14 NOTE — ED Notes (Signed)
Report to receiving RN.  Pt to go to floor with tele.

## 2013-06-15 LAB — BASIC METABOLIC PANEL
BUN: 26 mg/dL — AB (ref 6–23)
CALCIUM: 7.8 mg/dL — AB (ref 8.4–10.5)
CO2: 21 mEq/L (ref 19–32)
Chloride: 105 mEq/L (ref 96–112)
Creatinine, Ser: 1.33 mg/dL (ref 0.50–1.35)
GFR calc non Af Amer: 45 mL/min — ABNORMAL LOW (ref >90)
GFR, EST AFRICAN AMERICAN: 52 mL/min — AB (ref >90)
Glucose, Bld: 108 mg/dL — ABNORMAL HIGH (ref 70–99)
POTASSIUM: 4.7 meq/L (ref 3.7–5.3)
Sodium: 135 mEq/L — ABNORMAL LOW (ref 137–147)

## 2013-06-15 LAB — CBC
HCT: 25.8 % — ABNORMAL LOW (ref 39.0–52.0)
Hemoglobin: 7.9 g/dL — ABNORMAL LOW (ref 13.0–17.0)
MCH: 30 pg (ref 26.0–34.0)
MCHC: 30.6 g/dL (ref 30.0–36.0)
MCV: 98.1 fL (ref 78.0–100.0)
Platelets: 98 10*3/uL — ABNORMAL LOW (ref 150–400)
RBC: 2.63 MIL/uL — ABNORMAL LOW (ref 4.22–5.81)
RDW: 18.6 % — ABNORMAL HIGH (ref 11.5–15.5)
WBC: 6.2 10*3/uL (ref 4.0–10.5)

## 2013-06-15 LAB — URINE CULTURE
Colony Count: NO GROWTH
Culture: NO GROWTH

## 2013-06-15 NOTE — Progress Notes (Signed)
Subjective: He still feels somewhat weak and has a occasional dry cough. His appetite remains fair and he forces himself to be somewhat. He has not had problems with significant abdominal pain, nausea, vomiting, diarrhea, or constipation.  Objective: Vital signs in last 24 hours: Temp:  [97.4 F (36.3 C)-99 F (37.2 C)] 98.4 F (36.9 C) (05/05 0755) Pulse Rate:  [89-151] 89 (05/05 0755) Resp:  [16-18] 18 (05/05 0755) BP: (109-147)/(61-88) 109/72 mmHg (05/05 0755) SpO2:  [95 %-100 %] 95 % (05/05 0755) Weight change:    Intake/Output from previous day: 05/04 0701 - 05/05 0700 In: 960 [P.O.:960] Out: 475 [Urine:475]   General appearance: alert, cooperative and With occasional dry cough Resp: clear to auscultation bilaterally Cardio: regular rate and rhythm, S1, S2 normal, no murmur, click, rub or gallop GI: soft, non-tender; bowel sounds normal; no masses,  no organomegaly Extremities: extremities normal, atraumatic, no cyanosis or edema  Lab Results:  Recent Labs  06/14/13 0626 06/15/13 0615  WBC 7.9 6.2  HGB 6.9* 7.9*  HCT 22.2* 25.8*  PLT 90* 98*   BMET  Recent Labs  06/14/13 0626 06/15/13 0615  NA 135* 135*  K 4.1 4.7  CL 104 105  CO2 21 21  GLUCOSE 106* 108*  BUN 35* 26*  CREATININE 1.32 1.33  CALCIUM 7.3* 7.8*   CMET CMP     Component Value Date/Time   NA 135* 06/15/2013 0615   K 4.7 06/15/2013 0615   CL 105 06/15/2013 0615   CO2 21 06/15/2013 0615   GLUCOSE 108* 06/15/2013 0615   BUN 26* 06/15/2013 0615   CREATININE 1.33 06/15/2013 0615   CREATININE 1.23 01/25/2013 1033   CALCIUM 7.8* 06/15/2013 0615   PROT 5.9* 05/06/2013 1555   ALBUMIN 2.3* 05/06/2013 1555   AST 14 05/06/2013 1555   ALT 8 05/06/2013 1555   ALKPHOS 47 05/06/2013 1555   BILITOT 0.4 05/06/2013 1555   GFRNONAA 45* 06/15/2013 0615   GFRAA 52* 06/15/2013 0615    CBG (last 3)  No results found for this basename: GLUCAP,  in the last 72 hours  INR RESULTS:   Lab Results  Component Value Date   INR 1.69* 05/07/2013   INR 2.48* 05/06/2013   INR 1.22 02/12/2013     Studies/Results: Dg Chest Port 1 View  06/13/2013   CLINICAL DATA:  Fever, tachycardia  EXAM: PORTABLE CHEST - 1 VIEW  COMPARISON:  DG CHEST 1V PORT dated 01/09/2011  FINDINGS: There is no focal parenchymal opacity, pleural effusion, or pneumothorax. The heart size is normal. There is enlargement of the right hilum unchanged compared with 01/09/2011 likely reflecting prominent pulmonary vasculature. There is a dual lead cardiac pacer.  The osseous structures are unremarkable.  IMPRESSION: No active disease.   Electronically Signed   By: Kathreen Devoid   On: 06/13/2013 20:59    Medications: I have reviewed the patient's current medications.  Assessment/Plan: #1 Fever: improved and of uncertain cause. Possibly from infection at the site of aortoenteric fistula. Will change to po antibiotics at the time of discharge. #2 Anemia: improved after transfusion and stable. Will recheck CBC tomorrow  #3 Physical Deconditioning: severe and will attempt to ambulate with a walker and assistance today  LOS: 2 days   Leanna Battles 06/15/2013, 11:22 AM

## 2013-06-15 NOTE — Progress Notes (Signed)
Notified by Rosette Reveal, patient and family request services of [facility]) after discharge. Patient information reviewed with Dr Alferd Patee, [place] Director, eligibility confirmed . Spoke with pt, wife Inez Catalina and daughter Margaretha Sheffield at bedside to initiate education related to hospice services, philosophy and team approach to care; pt and family voiced good understanding of information provided.  Daughter shared she is a Company secretary and has worked with hospice in several other states. Pt and wife shared they have spoken with Dr Sharlett Iles and Dr Brigitte Pulse, and understand the gravity of his illness as he continues to have some internal bleeding r/t AAA and surgical repair is not an option; they shared that they hope to have many more comfortable days at home and want to avoid re-hospitalizations if possible. They are especially hopeful for all the family to be together on Mother's Day. The pt and family voice the following questions: 1) they are not sure about a discharge date; they thought it may be tomorrow, however they are waiting to see how pt does with ambulation and transfers- PT has not yet been by to work with pt.  2) Pt/wife question whether pt will be strong enough to ride in a car or if he will need non-emergent transport  3) Oxygen - currently pt is on O2 @ 3LNC this is new - would recommend, if Dr Sharlett Iles agrees, for O2 to be ordered at discharge for use at home- either continuous if need is determined  or 2-3 LNC prn   4) pt will need a travel O2 tank for transport  5) pt has received IV Morphine for pain, *would MD consider ordering PO for home prn use at discharge  *Please send completed GOLD DNR Form home with pt  DME needs discussed currently pt has a walker; he declines a hospital bed and BSC;request Complete Oxygen pkg B - currently on 3LNC continuous; and add a lightweight wheelchair **NOTE address on facesheet is not correct DME to be delivered  to:  [address] Call wife Inez Catalina at [phone] to arrange delivery time Patient's PCP Dr Sharlett Iles to be attending with [facility]; pharmacy [facility] [address] Initial paperwork faxed to [facility]  *Completed d/c summary will need to be faxed to [place] @ [phone] when final Please notify [facility] when patient is ready to leave unit at d/c call [phone] (or [phone] if after 5 pm);  [facility] information and contact numbers also given to wife and daughter during visit.   Above information shared with [facility] and writer will follow up in am Please call with any questions or concerns   Danton Sewer, RN 06/15/2013, 4:50 PM [facility] [place] RN Liaison [phone]

## 2013-06-15 NOTE — Progress Notes (Signed)
06/14/2013 1930  NCM spoke with patient's daughter Margaretha Sheffield [phone]) regarding discharge planning and choice for home hospice. [facility]  She and her mother will be available Tuesday afternoon in the patient's room.

## 2013-06-15 NOTE — Progress Notes (Signed)
HR sustaining 140's to 150's. BP is 145/83. Denies CP or SOB. No complaints. Asymptomatic. Dr. Forde Dandy notified. Will continue to monitor.

## 2013-06-16 LAB — BASIC METABOLIC PANEL
BUN: 23 mg/dL (ref 6–23)
CALCIUM: 8 mg/dL — AB (ref 8.4–10.5)
CO2: 22 mEq/L (ref 19–32)
Chloride: 104 mEq/L (ref 96–112)
Creatinine, Ser: 1.27 mg/dL (ref 0.50–1.35)
GFR calc non Af Amer: 48 mL/min — ABNORMAL LOW (ref >90)
GFR, EST AFRICAN AMERICAN: 55 mL/min — AB (ref >90)
GLUCOSE: 98 mg/dL (ref 70–99)
Potassium: 4.6 mEq/L (ref 3.7–5.3)
SODIUM: 135 meq/L — AB (ref 137–147)

## 2013-06-16 LAB — CBC
HCT: 25.1 % — ABNORMAL LOW (ref 39.0–52.0)
Hemoglobin: 7.6 g/dL — ABNORMAL LOW (ref 13.0–17.0)
MCH: 29.8 pg (ref 26.0–34.0)
MCHC: 30.3 g/dL (ref 30.0–36.0)
MCV: 98.4 fL (ref 78.0–100.0)
PLATELETS: 101 10*3/uL — AB (ref 150–400)
RBC: 2.55 MIL/uL — ABNORMAL LOW (ref 4.22–5.81)
RDW: 18.5 % — ABNORMAL HIGH (ref 11.5–15.5)
WBC: 5.8 10*3/uL (ref 4.0–10.5)

## 2013-06-16 MED ORDER — MAGNESIUM HYDROXIDE 400 MG/5ML PO SUSP
30.0000 mL | Freq: Once | ORAL | Status: AC
  Administered 2013-06-16: 30 mL via ORAL
  Filled 2013-06-16: qty 30

## 2013-06-16 NOTE — Progress Notes (Signed)
CARE MANAGEMENT NOTE 06/16/2013  Patient:  [name],[name]   Account Number:  [id number]  Date Initiated:  06/14/2013  Documentation initiated by:  [name] [name]  Subjective/Objective Assessment:   admitted with fever, hemorrhagic shock     Action/Plan:   home with [name]   Anticipated DC Date:  06/17/2013   Anticipated DC Plan:  [place]  CM consult      PAC Choice  [name]   Choice offered to / List presented to:        DME agency  [place] arranged  HH-[facility] RN      [facility] agency  [name] [place]   Status of service:  Completed, signed off Medicare Important Message given?  YES (If response is "NO", the following Medicare IM given date fields will be blank) Date Medicare IM given:  06/16/2013 Date Additional Medicare IM given:    Discharge Disposition:  [place]  Per UR Regulation:    If discussed at Long Length of Stay Meetings, dates discussed:    Comments:  contact:  [name] [name]  spouse  Home: [phone]    Cell: [phone]  06/16/2013  [place], [place] DME: per [place] [facility] [place] notes  O2 _0 , nasal cannula, conserving device, tank for transport light weight wheelchair  [facility] called with referral. advised of address not as listed on face sheet, see [name] notes.  06/15/2013  [place], [place] referral to [name] [name] met with patient, spouse and daughter regarding eferral process to [name] [place]  06/15/2013  [place], [place] [phone] spoke with daughter 06/14/2013 regarding choice for home [name], she selected [name] [name]   06/14/2013  [place], [place] CM referral: home [name] Call to spouse, left voice mail message at home and cell number to call NCM.

## 2013-06-16 NOTE — Evaluation (Signed)
Physical Therapy Evaluation Patient Details Name: [name] [name] MRN: [id number] DOB: 1919-04-08 Today's Date: 06/16/2013   History of Present Illness  [name] [name] is a pleasant 80 year old white male with a history of atrial fibrillation, prior abdominal aortic aneurysm repair with recently diagnosed profound anemia secondary to aortoenteric fistula who presented to the emergency department with the complaint of shaking chills. Patient was hospitalized from 3/26 bruits 3/28 for severe anemia with a hemoglobin of 6.5. He was transfused 2 units packed red blood cells and underwent an EGD which showed no active source of bleeding. CT angiogram of the abdomen and pelvis showed findings consistent with an aortic enteric fistula related to his prior AAA repair. Vascular surgery was consult it and did not feel that he was a surgical candidate. They recommended palliative/end of life care.  Since discharge on 3/28 he has felt fairly well. He has been participating with physical therapy and occupational therapy ambulating to the dining room at his independent living community.; Admitted this stay with fever, chills; Hemorrhagic shock; Pt and wife have opted for dc home with hospice services; PT ordered to assess functional mobility  Clinical Impression   Pt admitted with above. Pt currently with functional limitations due to the deficits listed below (see PT Problem List).  Pt will benefit from skilled PT to increase their independence and safety with mobility to allow discharge to the venue listed below.       Follow Up Recommendations Home health PT;Supervision/Assistance - 24 hour    Equipment Recommendations  Wheelchair (measurements PT);Other (comment) (oxygen)    Recommendations for Other Services       Precautions / Restrictions Precautions Precautions: Fall Precaution Comments: watch HR      Mobility  Bed Mobility               General bed mobility comments: in recliner upon  arrival  Transfers Overall transfer level: Needs assistance Equipment used: Rolling walker (2 wheeled) Transfers: Sit to/from Stand Sit to Stand: Mod assist         General transfer comment: light mod assist to power up  Ambulation/Gait Ambulation/Gait assistance: Min guard Ambulation Distance (Feet): 20 Feet Assistive device: Rolling walker (2 wheeled) Gait Pattern/deviations: Step-through pattern;Decreased stride length     General Gait Details: Cues to self-monitor for activity tolerance throughout session, especially with amb  Stairs            Wheelchair Mobility    Modified Rankin (Stroke Patients Only)       Balance Overall balance assessment: Needs assistance         Standing balance support: Bilateral upper extremity supported Standing balance-Leahy Scale: Poor Standing balance comment: Dependent on UE support                             Pertinent Vitals/Pain Tachycardic at rest and with activity; Range 120-165 bpm; RN notified desats on room air; restarted O2 via [place] 3 L    Home Living Family/patient expects to be discharged to:: Private residence Living Arrangements: Spouse/significant other Available Help at Discharge: Family;Available 24 hours/day Type of Home: Independent living facility Home Access: Level entry     Home Layout: Multi-level;Other (Comment) (elevator) Home Equipment: Walker - 2 wheels;Wheelchair - manual (oxygen)      Prior Function Level of Independence: Independent with assistive device(s)               Hand Dominance  Extremity/Trunk Assessment   Upper Extremity Assessment: Generalized weakness           Lower Extremity Assessment: Generalized weakness         Communication   Communication: No difficulties  Cognition Arousal/Alertness: Awake/alert Behavior During Therapy: WFL for tasks assessed/performed Overall Cognitive Status: Within Functional Limits for tasks assessed                       General Comments      Exercises        Assessment/Plan    PT Assessment Patient needs continued PT services  PT Diagnosis Generalized weakness   PT Problem List Decreased strength;Decreased activity tolerance;Decreased balance;Decreased mobility;Decreased knowledge of use of DME;Decreased knowledge of precautions;Cardiopulmonary status limiting activity  PT Treatment Interventions     PT Goals (Current goals can be found in the Care Plan section) Acute Rehab PT Goals Patient Stated Goal: to go home and be comfortable; to have family together on Mother's day PT Goal Formulation: With patient Time For Goal Achievement: 06/23/13 Potential to Achieve Goals: Fair    Frequency     Barriers to discharge   Would hope that pt will be able to transfer with min assist to supervision before going home; Can [place] ramp up services for pt?    Co-evaluation               End of Session Equipment Utilized During Treatment: Oxygen Activity Tolerance: Other (comment) (quite tachycardic at rest and with activity) Patient left: in chair;with call bell/phone within reach;with family/visitor present Nurse Communication: Mobility status         Time: 1412-1440 PT Time Calculation (min): 28 min   Charges:   PT Evaluation $Initial PT Evaluation Tier I: 1 Procedure PT Treatments $Gait Training: 8-22 mins $Therapeutic Activity: 8-22 mins   PT G Codes:          [facility] [place] 06/16/2013, 4:53 PM  Roney [place], [place] Pager [phone] Office [phone]

## 2013-06-16 NOTE — Evaluation (Deleted)
Physical Therapy Evaluation Patient Details Name: Slade Pierpoint MRN: [id number] DOB: 1919-04-23 Today's Date: 06/16/2013   History of Present Illness  Mr. 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 Patient was hospitalized from 3/26 bruits 3/28 for severe anemia with a hemoglobin of 6.5. He was transfused 2 units packed red blood cells and underwent an EGD which showed no active source of bleeding. CT angiogram of the abdomen and pelvis showed findings consistent with an aortic enteric fistula related to his prior AAA repair. Vascular surgery was consult it and did not feel that he was a surgical candidate. They recommended palliative/end of life care.  Since discharge on 3/28 he has felt fairly well. He has been participating with physical therapy and occupational therapy ambulating to the dining room at his independent living community.; Admitted this stay with fever, chills; Hemorrhagic shock; Pt and wife have opted for dc home with hospice services; PT ordered to assess functional mobility  Clinical Impression   Patient evaluated by Physical Therapy with no further acute PT needs identified. All education has been completed; Plan is for dc home today or tomorrow with Hospice Services;  Pt did have concerns re: when and if he should decide to use the O2 at home, and how to safely incr his activity level;  See below for any follow-up Physical Therapy or equipment needs.  Can pt receive HHPT through Hospice?   Acute PT is signing off. Thank you for this referral.     Follow Up Recommendations Supervision/Assistance - 24 hour; Home health PT if it can be arranged through [facility] (measurements PT);Other (comment) (oxygen)  I  agree with non-emergent ambulance transport or medical van transport home   Recommendations for Other Services       Precautions / Restrictions Precautions Precautions: Fall Precaution Comments: watch HR      Mobility  Bed Mobility               General bed mobility comments: in recliner upon arrival  Transfers Overall transfer level: Needs assistance Equipment used: Rolling walker (2 wheeled) Transfers: Sit to/from Stand Sit to Stand: Mod assist         General transfer comment: light mod assist to power up  Ambulation/Gait Ambulation/Gait assistance: Min guard Ambulation Distance (Feet): 20 Feet Assistive device: Rolling walker (2 wheeled) Gait Pattern/deviations: Step-through pattern;Decreased stride length     General Gait Details: Cues to self-monitor for activity tolerance throughout session, especially with amb  Stairs            Wheelchair Mobility    Modified Rankin (Stroke Patients Only)       Balance Overall balance assessment: Needs assistance         Standing balance support: Bilateral upper extremity supported Standing balance-Leahy Scale: Poor Standing balance comment: Dependent on UE support                             Pertinent Vitals/Pain Tachycardic the entire session at rest and during activity; Range 120 through 160 observed highest; RN notified Pt desatted on Room Air; encouraged him to use supplemental O2    Home Living Family/patient expects to be discharged to:: Private residence Living Arrangements: Spouse/significant other Available Help at  Discharge: Family;Available 24 hours/day Type of Home: Independent living facility Home Access: Level entry     Home Layout: Multi-level;Other (Comment) (elevator) Home Equipment: Walker - 2 wheels;Wheelchair - manual (oxygen)      Prior Function Level of Independence: Independent with assistive device(s)               Hand Dominance         Extremity/Trunk Assessment   Upper Extremity Assessment: Generalized weakness           Lower Extremity Assessment: Generalized weakness         Communication   Communication: No difficulties  Cognition Arousal/Alertness: Awake/alert Behavior During Therapy: WFL for tasks assessed/performed Overall Cognitive Status: Within Functional Limits for tasks assessed                      General Comments      Exercises        Assessment/Plan    PT Assessment All further PT needs can be met in the next venue of care  PT Diagnosis Generalized weakness   PT Problem List Decreased strength;Decreased activity tolerance;Decreased balance;Decreased mobility;Decreased knowledge of use of DME;Decreased knowledge of precautions;Cardiopulmonary status limiting activity  PT Treatment Interventions     PT Goals (Current goals can be found in the Care Plan section) Acute Rehab PT Goals Patient Stated Goal: to go home and be comfortable; to have family together on Mother's day PT Goal Formulation: No goals set, d/c therapy    Frequency     Barriers to discharge        Co-evaluation               End of Session Equipment Utilized During Treatment: Oxygen Activity Tolerance: Other (comment) (quite tachycardic at rest and with activity) Patient left: in chair;with call bell/phone within reach;with family/visitor present Nurse Communication: Mobility status         Time: 1412-1440 PT Time Calculation (min): 28 min   Charges:   PT Evaluation $Initial PT Evaluation Tier I: 1 Procedure PT Treatments $Gait Training: 8-22 mins $Therapeutic Activity: 8-22 mins   PT G Codes:          [facility] 06/16/2013, 4:43 PM Roney Marion, [place] Pager [phone] Office [phone]

## 2013-06-16 NOTE — Progress Notes (Signed)
ANTIBIOTIC CONSULT NOTE - FOLLOW UP  Pharmacy Consult for Vancomycin + Cefepime Indication: Empiric coverage in setting of fever  No Known Allergies  Patient Measurements: Height: 5\' 9"  (175.3 cm) Weight: 163 lb 11.8 oz (74.27 kg) IBW/kg (Calculated) : 70.7  Vital Signs: Temp: 97.3 F (36.3 C) (05/06 1022) Temp src: Oral (05/06 1022) BP: 112/53 mmHg (05/06 1022) Pulse Rate: 141 (05/06 1022) Intake/Output from previous day: 05/05 0701 - 05/06 0700 In: 1040 [P.O.:1040] Out: 1630 [Urine:1630] Intake/Output from this shift:    Labs:  Recent Labs  06/14/13 0626 06/15/13 0615 06/16/13 0642  WBC 7.9 6.2 5.8  HGB 6.9* 7.9* 7.6*  PLT 90* 98* 101*  CREATININE 1.32 1.33 1.27   Estimated Creatinine Clearance: 37.9 ml/min (by C-G formula based on Cr of 1.27). No results found for this basename: VANCOTROUGH, VANCOPEAK, VANCORANDOM, GENTTROUGH, GENTPEAK, GENTRANDOM, TOBRATROUGH, TOBRAPEAK, TOBRARND, AMIKACINPEAK, AMIKACINTROU, AMIKACIN,  in the last 72 hours   Microbiology: Recent Results (from the past 720 hour(s))  CULTURE, BLOOD (ROUTINE X 2)     Status: None   Collection Time    06/13/13 10:25 PM      Result Value Ref Range Status   Specimen Description BLOOD RIGHT HAND   Final   Special Requests BOTTLES DRAWN AEROBIC AND ANAEROBIC 5CC EA   Final   Culture  Setup Time     Final   Value: 06/14/2013 09:37     Performed at [facility]   Culture     Final   Value:        BLOOD CULTURE RECEIVED NO GROWTH TO DATE CULTURE WILL BE HELD FOR 5 DAYS BEFORE ISSUING A FINAL NEGATIVE REPORT     Performed at [facility]   Report Status PENDING   Incomplete  CULTURE, BLOOD (ROUTINE X 2)     Status: None   Collection Time    06/13/13 10:53 PM      Result Value Ref Range Status   Specimen Description BLOOD LEFT ARM   Final   Special Requests BOTTLES DRAWN AEROBIC AND ANAEROBIC 2CC EA   Final   Culture  Setup Time     Final   Value: 06/14/2013 09:38     Performed at  [facility]   Culture     Final   Value:        BLOOD CULTURE RECEIVED NO GROWTH TO DATE CULTURE WILL BE HELD FOR 5 DAYS BEFORE ISSUING A FINAL NEGATIVE REPORT     Performed at [facility]   Report Status PENDING   Incomplete  URINE CULTURE     Status: None   Collection Time    06/13/13 11:19 PM      Result Value Ref Range Status   Specimen Description URINE, CATHETERIZED   Final   Special Requests NONE   Final   Culture  Setup Time     Final   Value: 06/14/2013 09:56     Performed at [place]     Final   Value: NO GROWTH     Performed at [facility]   Culture     Final   Value: NO GROWTH     Performed at [facility]   Report Status 06/15/2013 FINAL   Final    Anti-infectives   Start     Dose/Rate Route Frequency Ordered Stop   06/13/13 2215  ceFEPIme (MAXIPIME) 1 g in dextrose 5 % 50 mL IVPB  1 g 100 mL/hr over 30 Minutes Intravenous Every 24 hours 06/13/13 2208     06/13/13 2215  vancomycin (VANCOCIN) IVPB 750 mg/150 ml premix     750 mg 150 mL/hr over 60 Minutes Intravenous Every 24 hours 06/13/13 2208        Assessment: 80 y.o. M who continues on Vancomycin + Cefepime for empiric coverage in the setting of fevers on admission. Afebrile, WBC wnl, SCr 1.27, CrCl~30-40 ml/min (stable). Doses remain appropriate for now.  Goal of Therapy:  Vancomycin trough level 15-20 mcg/ml Proper antibiotics for infection/cultures adjusted for renal/hepatic function   Plan:  1. Continue Vancomycin 750 mg IV every 24 hours 2. Continue Cefepime 1g IV every 24 hours 3. Will continue to follow renal function, culture results, LOT, and antibiotic de-escalation plans   Alycia Rossetti, PharmD, BCPS Clinical Pharmacist Pager: [phone] 06/16/2013 11:48 AM

## 2013-06-16 NOTE — Progress Notes (Signed)
Subjective: He continues to feel weak.  He complains of mild constipation with no bowel movements in 3 days.  Objective: Vital signs in last 24 hours: Temp:  [97.6 F (36.4 C)-98.4 F (36.9 C)] 97.6 F (36.4 C) (05/06 0506) Pulse Rate:  [87-128] 128 (05/06 0506) Resp:  [16-18] 16 (05/06 0506) BP: (99-136)/(57-80) 136/75 mmHg (05/06 0506) SpO2:  [96 %-98 %] 98 % (05/06 0506) Weight change:    Intake/Output from previous day: 05/05 0701 - 05/06 0700 In: 1040 [P.O.:1040] Out: 1630 [Urine:1630]   General appearance: alert, cooperative and no distress Resp: clear to auscultation bilaterally Cardio: irregularly irregular rhythm GI: soft, non-tender; bowel sounds normal; no masses,  no organomegaly Extremities: bilateral 1+ leg edema  Lab Results:  Recent Labs  06/15/13 0615 06/16/13 0642  WBC 6.2 5.8  HGB 7.9* 7.6*  HCT 25.8* 25.1*  PLT 98* 101*   BMET  Recent Labs  06/15/13 0615 06/16/13 0642  NA 135* 135*  K 4.7 4.6  CL 105 104  CO2 21 22  GLUCOSE 108* 98  BUN 26* 23  CREATININE 1.33 1.27  CALCIUM 7.8* 8.0*   CMET CMP     Component Value Date/Time   NA 135* 06/16/2013 0642   K 4.6 06/16/2013 0642   CL 104 06/16/2013 0642   CO2 22 06/16/2013 0642   GLUCOSE 98 06/16/2013 0642   BUN 23 06/16/2013 0642   CREATININE 1.27 06/16/2013 0642   CREATININE 1.23 01/25/2013 1033   CALCIUM 8.0* 06/16/2013 0642   PROT 5.9* 05/06/2013 1555   ALBUMIN 2.3* 05/06/2013 1555   AST 14 05/06/2013 1555   ALT 8 05/06/2013 1555   ALKPHOS 47 05/06/2013 1555   BILITOT 0.4 05/06/2013 1555   GFRNONAA 48* 06/16/2013 0642   GFRAA 55* 06/16/2013 0642    CBG (last 3)  No results found for this basename: GLUCAP,  in the last 72 hours  INR RESULTS:   Lab Results  Component Value Date   INR 1.69* 05/07/2013   INR 2.48* 05/06/2013   INR 1.22 02/12/2013     Studies/Results: No results found.  Medications: I have reviewed the patient's current medications.  Assessment/Plan: #1 Fever: stable on  broad-spectrum antibiotics #2 Anemia: stable with iron supplementation following transfusion of 2 units of packed red blood cells #3 Gait instability: Moderate and physical therapy evaluation is pending #4 physical deconditioning: severe and we will likely discharge to home with home hospice care tomorrow.   LOS: 3 days   Leanna Battles 06/16/2013, 8:33 AM

## 2013-06-17 DIAGNOSIS — I5033 Acute on chronic diastolic (congestive) heart failure: Secondary | ICD-10-CM | POA: Diagnosis present

## 2013-06-17 LAB — BASIC METABOLIC PANEL
BUN: 21 mg/dL (ref 6–23)
CALCIUM: 7.8 mg/dL — AB (ref 8.4–10.5)
CO2: 24 mEq/L (ref 19–32)
Chloride: 106 mEq/L (ref 96–112)
Creatinine, Ser: 1.14 mg/dL (ref 0.50–1.35)
GFR calc Af Amer: 63 mL/min — ABNORMAL LOW (ref >90)
GFR, EST NON AFRICAN AMERICAN: 54 mL/min — AB (ref >90)
Glucose, Bld: 113 mg/dL — ABNORMAL HIGH (ref 70–99)
POTASSIUM: 4.6 meq/L (ref 3.7–5.3)
SODIUM: 137 meq/L (ref 137–147)

## 2013-06-17 LAB — TYPE AND SCREEN
ABO/RH(D): O NEG
Antibody Screen: NEGATIVE
UNIT DIVISION: 0
Unit division: 0

## 2013-06-17 LAB — CBC
HCT: 24.8 % — ABNORMAL LOW (ref 39.0–52.0)
HEMOGLOBIN: 7.5 g/dL — AB (ref 13.0–17.0)
MCH: 30 pg (ref 26.0–34.0)
MCHC: 30.2 g/dL (ref 30.0–36.0)
MCV: 99.2 fL (ref 78.0–100.0)
Platelets: 102 10*3/uL — ABNORMAL LOW (ref 150–400)
RBC: 2.5 MIL/uL — AB (ref 4.22–5.81)
RDW: 18.6 % — ABNORMAL HIGH (ref 11.5–15.5)
WBC: 6.7 10*3/uL (ref 4.0–10.5)

## 2013-06-17 MED ORDER — AMOXICILLIN-POT CLAVULANATE 875-125 MG PO TABS: 1.0000 | ORAL_TABLET | Freq: Two times a day (BID) | ORAL | Status: AC

## 2013-06-17 MED ORDER — METOPROLOL TARTRATE 25 MG PO TABS
25.0000 mg | ORAL_TABLET | Freq: Two times a day (BID) | ORAL | Status: DC
  Administered 2013-06-17: 25 mg via ORAL
  Filled 2013-06-17 (×2): qty 1

## 2013-06-17 MED ORDER — SENNOSIDES-DOCUSATE SODIUM 8.6-50 MG PO TABS: 1.0000 | ORAL_TABLET | Freq: Two times a day (BID) | ORAL | Status: AC

## 2013-06-17 MED ORDER — HYDROCODONE-ACETAMINOPHEN 5-325 MG PO TABS: 1.0000 | ORAL_TABLET | ORAL | Status: AC | PRN

## 2013-06-17 MED ORDER — VITAMIN C 500 MG PO TABS
500.0000 mg | ORAL_TABLET | Freq: Two times a day (BID) | ORAL | Status: DC
  Administered 2013-06-17: 500 mg via ORAL
  Filled 2013-06-17 (×2): qty 1

## 2013-06-17 MED ORDER — ASCORBIC ACID 500 MG PO TABS: 500.0000 mg | ORAL_TABLET | Freq: Two times a day (BID) | ORAL | Status: AC

## 2013-06-17 NOTE — Progress Notes (Signed)
Patient discharged home with home health hospice.  Patient educated on discharge instructions, discharge medications, and follow-up appointments.  Patient verbalized understanding, AVS signed.  [facility] set up oxygen at patient home.  [facility] delivered portable oxygen tank to patient room.  Patient prescriptions sent to [facility].  Prescription for pain medication given to patient.  IVs removed.  Patient belongings gathered.  Escorted off unit with NT via wheelchair.

## 2013-06-17 NOTE — Discharge Summary (Signed)
Physician Discharge Summary  Patient ID: [name] [name] MRN: [id number] DOB/AGE: [birth year] 80 y.o.  Admit date: 06/13/2013 Discharge date: 06/17/2013   Discharge Diagnoses:  Principal Problem:   Hemorrhagic shock Active Problems:   Anemia   Fever   Aortoenteric fistula   Atrial fibrillation with RVR   Acute renal failure   Diastolic CHF, acute on chronic   Discharged Condition: fair  Hospital Course: [name] [name] is a pleasant 80 year old white male with a history of atrial fibrillation, prior abdominal aortic aneurysm repair with recently diagnosed profound anemia secondary to aortoenteric fistula who presented to the emergency department with the complaint of shaking chills. Patient was hospitalized from 3/26 bruits 3/28 for severe anemia with a hemoglobin of 6.5. He was transfused 2 units packed red blood cells and underwent an EGD which showed no active source of bleeding. CT angiogram of the abdomen and pelvis showed findings consistent with an aortic enteric fistula related to his prior AAA repair. Vascular surgery was consult it and did not feel that he was a surgical candidate. They recommended palliative/end of life care. Since discharge on 3/28 he has felt fairly well. He has been participating with physical therapy and occupational therapy ambulating to the dining room at his independent living community. His stools occur daily and remain black which he attributes to iron. Today, around dinnertime he began having shaking chills and weakness. This prompted his wife to call EMS. Brought to the emergency department where his temperature is 101.8, heart rate 146, and blood pressure in the 80s to 100s. His hemoglobin is again 6.4. He was started on a Cardizem drip in the emergency department but his blood pressure remains low. He has received 750 cc of normal saline with no urine output. We were called for admission.  He was admitted to a medical bed with telemetry. He was given transfusion  of several units of packed red blood cells which he tolerated well. He was also started on broad-spectrum antibiotics for possible abdominal infection at the site of his aortoenteric fistula. He did well with this with rapid defervescence and improvement in his hemoglobin and hematocrit. He still remained quite weak, and he was seen by personnel from hospice to arrange to continue care as an outpatient at his home. He had no complications from his hospitalization. On the day of discharge he continued to have mild to moderate dyspnea with minimal exertion associated with hypoxia. He is not having a productive cough or chest pain, and he has been eating and drinking well without gross melena.   Consults: None  Significant Diagnostic Studies:  No results found.  Labs: Lab Results  Component Value Date   WBC 6.7 06/17/2013   HGB 7.5* 06/17/2013   HCT 24.8* 06/17/2013   MCV 99.2 06/17/2013   PLT 102* 06/17/2013     Recent Labs Lab 06/17/13 0541  NA 137  K 4.6  CL 106  CO2 24  BUN 21  CREATININE 1.14  CALCIUM 7.8*  GLUCOSE 113*       Lab Results  Component Value Date   INR 1.69* 05/07/2013   INR 2.48* 05/06/2013   INR 1.22 02/12/2013     Recent Results (from the past 240 hour(s))  CULTURE, BLOOD (ROUTINE X 2)     Status: None   Collection Time    06/13/13 10:25 PM      Result Value Ref Range Status   Specimen Description BLOOD RIGHT HAND   Final   Special Requests BOTTLES DRAWN  AEROBIC AND ANAEROBIC 5CC EA   Final   Culture  Setup Time     Final   Value: 06/14/2013 09:37     Performed at [facility]   Culture     Final   Value:        BLOOD CULTURE RECEIVED NO GROWTH TO DATE CULTURE WILL BE HELD FOR 5 DAYS BEFORE ISSUING A FINAL NEGATIVE REPORT     Performed at [facility]   Report Status PENDING   Incomplete  CULTURE, BLOOD (ROUTINE X 2)     Status: None   Collection Time    06/13/13 10:53 PM      Result Value Ref Range Status   Specimen Description BLOOD LEFT  ARM   Final   Special Requests BOTTLES DRAWN AEROBIC AND ANAEROBIC 2CC EA   Final   Culture  Setup Time     Final   Value: 06/14/2013 09:38     Performed at [facility]   Culture     Final   Value:        BLOOD CULTURE RECEIVED NO GROWTH TO DATE CULTURE WILL BE HELD FOR 5 DAYS BEFORE ISSUING A FINAL NEGATIVE REPORT     Performed at [facility]   Report Status PENDING   Incomplete  URINE CULTURE     Status: None   Collection Time    06/13/13 11:19 PM      Result Value Ref Range Status   Specimen Description URINE, CATHETERIZED   Final   Special Requests NONE   Final   Culture  Setup Time     Final   Value: 06/14/2013 09:56     Performed at [place]     Final   Value: NO GROWTH     Performed at [facility]   Culture     Final   Value: NO GROWTH     Performed at [facility]   Report Status 06/15/2013 FINAL   Final      Discharge Exam: Blood pressure 111/67, pulse 124, temperature 97.9 F (36.6 C), temperature source Oral, resp. rate 16, height 5\' 9"  (1.753 m), weight 74.27 kg (163 lb 11.8 oz), SpO2 99.00%.  Physical Exam: In general, the patient is a frail elderly white man who was in no apparent distress on nasal cannula oxygen while sitting at the bedside. HEENT exam was in for pale conjunctivae, neck was supple without jugular venous distention or carotid bruit, chest had scattered minimal extra wheezing with no crackles or use of accessory muscles of respiration, heart had an irregularly irregular rhythm at a rapid rate without significant murmur, abdomen had normal bowel sounds no tenderness, he had bilateral 1+ being edema of the legs. He was alert and well oriented with normal affect and could move all extremities well.  Disposition: He'll be discharged from the hospital today to return home via ambulance because of his dyspnea with minimal activity. He'll be seen by home hospice nursing personnel at home. I will  contact he and his wife regarding a followup visit at his home this coming week.  Discharge Orders   Future Appointments Provider Department Dept Phone   06/24/2013 10:00 AM [facility] [phone]   Liquids only 4 hours prior to your exam. Any medications can be taken as usual. Please arrive 15 min prior to your scheduled exam time.   06/24/2013 11:45 AM Juanda Crumble  Antony Blackbird, MD [facility] [phone]   08/16/2013 12:00 PM [facility] [place] Office [phone]   11/10/2013 2:30 PM Kathrynn Ducking, MD [facility] [phone]   Future Orders Complete By Expires   Call MD for:  As directed    Diet - low sodium heart healthy  As directed    Discharge instructions  As directed    Increase activity slowly  As directed        Medication List    STOP taking these medications       finasteride 5 MG tablet  Commonly known as:  PROSCAR     simvastatin 20 MG tablet  Commonly known as:  ZOCOR      TAKE these medications       allopurinol 300 MG tablet  Commonly known as:  ZYLOPRIM  Take 300 mg by mouth daily.     amoxicillin-clavulanate 875-125 MG per tablet  Commonly known as:  AUGMENTIN  Take 1 tablet by mouth 2 (two) times daily.     ascorbic acid 500 MG tablet  Commonly known as:  VITAMIN C  Take 1 tablet (500 mg total) by mouth 2 (two) times daily.     CALCIUM-VITAMIN D PO  Take 1 tablet by mouth 2 (two) times daily.     HYDROcodone-acetaminophen 5-325 MG per tablet  Commonly known as:  NORCO  Take 1 tablet by mouth every 4 (four) hours as needed for moderate pain.     iron polysaccharides 150 MG capsule  Commonly known as:  NIFEREX  Take 150 mg by mouth 2 (two) times daily.     metoprolol 50 MG tablet  Commonly known as:  LOPRESSOR  Take 25 mg by mouth 2 (two) times daily.     pantoprazole 40 MG tablet  Commonly known as:  PROTONIX  Take 1  tablet (40 mg total) by mouth daily at 6 (six) AM.     senna-docusate 8.6-50 MG per tablet  Commonly known as:  Senokot-S  Take 1 tablet by mouth 2 (two) times daily.         Signed: Leanna Battles 06/17/2013, 8:39 AM

## 2013-06-19 ENCOUNTER — Telehealth (HOSPITAL_BASED_OUTPATIENT_CLINIC_OR_DEPARTMENT_OTHER): Payer: Self-pay | Admitting: *Deleted

## 2013-06-19 NOTE — Telephone Encounter (Signed)
[facility] lab called to give + blood culture report.  Pt was admitted & discharged by MD Philip Aspen, and MD Paterson's phone number was given to lab tech.

## 2013-06-20 LAB — CULTURE, BLOOD (ROUTINE X 2): Culture: NO GROWTH

## 2013-06-21 LAB — CULTURE, BLOOD (ROUTINE X 2)

## 2013-06-24 ENCOUNTER — Other Ambulatory Visit: Payer: Medicare Other

## 2013-06-24 ENCOUNTER — Ambulatory Visit: Payer: Medicare Other | Admitting: Vascular Surgery

## 2013-07-01 ENCOUNTER — Ambulatory Visit: Payer: Medicare Other | Admitting: Vascular Surgery

## 2013-07-12 DEATH — deceased

## 2013-08-16 ENCOUNTER — Encounter: Payer: Medicare Other | Admitting: *Deleted

## 2013-08-17 ENCOUNTER — Encounter: Payer: Self-pay | Admitting: Cardiology

## 2013-11-10 ENCOUNTER — Telehealth: Payer: Self-pay | Admitting: Neurology

## 2013-11-10 ENCOUNTER — Ambulatory Visit: Payer: Medicare Other | Admitting: Neurology

## 2013-11-10 NOTE — Telephone Encounter (Signed)
This patient did not show for a revisit appointment today. 

## 2014-01-20 ENCOUNTER — Encounter (HOSPITAL_COMMUNITY): Payer: Self-pay | Admitting: Internal Medicine

## 2014-06-06 ENCOUNTER — Other Ambulatory Visit: Payer: Self-pay

## 2014-06-07 ENCOUNTER — Other Ambulatory Visit: Payer: Self-pay
